# Patient Record
Sex: Female | Born: 1940
Health system: Southern US, Community
[De-identification: ages and names within clinical notes are randomized; demographics above are authoritative.]

## PROBLEM LIST (undated history)

## (undated) DIAGNOSIS — I10 Essential (primary) hypertension: Secondary | ICD-10-CM

## (undated) DIAGNOSIS — E7800 Pure hypercholesterolemia, unspecified: Secondary | ICD-10-CM

## (undated) DIAGNOSIS — K449 Diaphragmatic hernia without obstruction or gangrene: Secondary | ICD-10-CM

## (undated) DIAGNOSIS — E039 Hypothyroidism, unspecified: Secondary | ICD-10-CM

## (undated) DIAGNOSIS — C439 Malignant melanoma of skin, unspecified: Secondary | ICD-10-CM

## (undated) HISTORY — PX: TUBAL LIGATION: SHX77

## (undated) HISTORY — PX: TONSILLECTOMY AND ADENOIDECTOMY: SUR1326

---

## 2013-08-27 ENCOUNTER — Encounter: Payer: Self-pay | Admitting: Emergency Medicine

## 2013-08-27 ENCOUNTER — Emergency Department (INDEPENDENT_AMBULATORY_CARE_PROVIDER_SITE_OTHER)
Admission: EM | Admit: 2013-08-27 | Discharge: 2013-08-27 | Disposition: A | Discharge status: Home / Self Care | Payer: Medicare Other | Source: Home / Self Care | Attending: Emergency Medicine | Admitting: Emergency Medicine

## 2013-08-27 DIAGNOSIS — L237 Allergic contact dermatitis due to plants, except food: Secondary | ICD-10-CM

## 2013-08-27 DIAGNOSIS — L255 Unspecified contact dermatitis due to plants, except food: Secondary | ICD-10-CM

## 2013-08-27 DIAGNOSIS — C439 Malignant melanoma of skin, unspecified: Secondary | ICD-10-CM | POA: Insufficient documentation

## 2013-08-27 DIAGNOSIS — E039 Hypothyroidism, unspecified: Secondary | ICD-10-CM | POA: Insufficient documentation

## 2013-08-27 DIAGNOSIS — E7800 Pure hypercholesterolemia, unspecified: Secondary | ICD-10-CM | POA: Insufficient documentation

## 2013-08-27 DIAGNOSIS — K449 Diaphragmatic hernia without obstruction or gangrene: Secondary | ICD-10-CM | POA: Insufficient documentation

## 2013-08-27 DIAGNOSIS — I10 Essential (primary) hypertension: Secondary | ICD-10-CM | POA: Insufficient documentation

## 2013-08-27 HISTORY — DX: Hypothyroidism, unspecified: E03.9

## 2013-08-27 HISTORY — DX: Essential (primary) hypertension: I10

## 2013-08-27 HISTORY — DX: Pure hypercholesterolemia, unspecified: E78.00

## 2013-08-27 HISTORY — DX: Malignant melanoma of skin, unspecified: C43.9

## 2013-08-27 HISTORY — DX: Diaphragmatic hernia without obstruction or gangrene: K44.9

## 2013-08-27 MED ORDER — PREDNISONE (PAK) 10 MG PO TABS: ORAL_TABLET | Freq: Every day | ORAL | Status: DC

## 2013-08-27 MED ORDER — METHYLPREDNISOLONE ACETATE 80 MG/ML IJ SUSP
80.0000 mg | Freq: Once | INTRAMUSCULAR | Status: AC
  Administered 2013-08-27: 80 mg via INTRAMUSCULAR

## 2013-08-27 NOTE — ED Provider Notes (Signed)
CSN: [id number]     Arrival date & time 08/27/13  0914 History   First MD Initiated Contact with Patient 08/27/13 [phone]     Chief Complaint  Patient presents with  . Rash   (Consider location/radiation/quality/duration/timing/severity/associated sxs/prior Treatment) Patient is a 73 y.o. female presenting with rash.  Rash This patient complains of a RASH  Location: whole body  Onset: 4-5 days ago, after working out in the yard in seeing poison ivy   Course: Worsening Self-treated with: Over-the-counter cortisone and calamine           Improvement with treatment: Not helping much  History Itching: yes  Tenderness: no  New medications/antibiotics: no  Pet exposure: no  Recent travel or tropical exposure: no  New soaps, shampoos, detergent, clothing: no  Tick/insect exposure: no   Red Flags Feeling ill: no  Fever: no  Facial/tongue swelling/difficulty breathing:  no  Diabetic or immunocompromised: no      Past Medical History  Diagnosis Date  . Hypertension   . Hypercholesteremia   . Melanoma     removed  . Hypothyroid   . Hiatal hernia    Past Surgical History  Procedure Laterality Date  . Tonsillectomy and adenoidectomy    . Tubal ligation     Family History  Problem Relation Age of Onset  . Cancer Mother     Breast  . Heart attack Mother   . Cancer Sister     breast   History  Substance Use Topics  . Smoking status: Never Smoker   . Smokeless tobacco: Never Used  . Alcohol Use: No   OB History   Grav Para Term Preterm Abortions TAB SAB Ect Mult Living                 Review of Systems  Skin: Positive for rash.  All other systems reviewed and are negative.   Allergies  Review of patient's allergies indicates no known allergies.  Home Medications   Prior to Admission medications   Medication Sig Start Date End Date Taking? Authorizing Provider  aspirin 81 MG tablet Take 81 mg by mouth daily.   Yes Historical Provider, MD  levothyroxine  (SYNTHROID, LEVOTHROID) 25 MCG tablet Take 25 mcg by mouth daily before breakfast.   Yes Historical Provider, MD  olmesartan-hydrochlorothiazide (BENICAR HCT) 40-12.5 MG per tablet Take 1 tablet by mouth daily.   Yes Historical Provider, MD  omeprazole (PRILOSEC) 40 MG capsule Take 40 mg by mouth daily.   Yes Historical Provider, MD  rosuvastatin (CRESTOR) 10 MG tablet Take 10 mg by mouth daily.   Yes Historical Provider, MD  Vitamin D, Cholecalciferol, 1000 UNITS TABS Take 5,000 Int'l Units/day by mouth.   Yes Historical Provider, MD  predniSONE (STERAPRED UNI-PAK) 10 MG tablet Take by mouth daily. 6 day pack, use as directed 08/27/13   Janeann Forehand, MD   BP 125/84  Pulse 108  Temp(Src) 98.1 F (36.7 C) (Oral)  Ht 5\' 6"  (1.676 m)  Wt 194 lb (87.998 kg)  BMI 31.33 kg/m2  SpO2 96% Physical Exam  Nursing note and vitals reviewed. Constitutional: She is oriented to person, place, and time. She appears well-developed and well-nourished.  HENT:  Head: Normocephalic and atraumatic.  Eyes: No scleral icterus.  Neck: Neck supple.  Cardiovascular: Regular rhythm and normal heart sounds.   Pulmonary/Chest: Effort normal and breath sounds normal. No respiratory distress.  Neurological: She is alert and oriented to person, place, and time.  Skin: Skin  is warm and dry.  Scattered linear excoriations throughout entire body, worse on arms and torso.  No signs of active bacterial superinfection.  (There is also a raised circular lesion on her right leg that is inflamed but not infected the patient states she is followed by dermatologist for and has an upcoming appointment.)  Psychiatric: She has a normal mood and affect. Her speech is normal.    ED Course  Procedures (including critical care time) Labs Review Labs Reviewed - No data to display  Imaging Review No results found.   MDM   1. Poison ivy    Patient with poison ivy dermatitis.  A shot of Depo-Medrol was given in clinic in a  prescription for prednisone and was given outpatient.  Cool compresses topical cortisone and calamine lotion.  Avoid scratching.  Follow up if not improving.  Differential diagnosis of linear excoriations also include scabies if not improving.  Janeann Forehand, MD 08/27/13 1340

## 2013-08-27 NOTE — ED Notes (Signed)
Jennifer Ray complains of rash on her right arm and left side of face for 5 days. She reports the rash is very itchy. Denies fever, chills or sweats.

## 2015-03-09 ENCOUNTER — Ambulatory Visit (INDEPENDENT_AMBULATORY_CARE_PROVIDER_SITE_OTHER): Payer: Medicare Other | Admitting: Family Medicine

## 2015-03-09 ENCOUNTER — Emergency Department (INDEPENDENT_AMBULATORY_CARE_PROVIDER_SITE_OTHER): Payer: Medicare Other

## 2015-03-09 ENCOUNTER — Encounter: Payer: Self-pay | Admitting: *Deleted

## 2015-03-09 ENCOUNTER — Emergency Department (INDEPENDENT_AMBULATORY_CARE_PROVIDER_SITE_OTHER)
Admission: EM | Admit: 2015-03-09 | Discharge: 2015-03-09 | Disposition: A | Discharge status: Home / Self Care | Payer: Medicare Other | Source: Home / Self Care | Attending: Family Medicine | Admitting: Family Medicine

## 2015-03-09 DIAGNOSIS — M222X1 Patellofemoral disorders, right knee: Secondary | ICD-10-CM | POA: Diagnosis not present

## 2015-03-09 DIAGNOSIS — M179 Osteoarthritis of knee, unspecified: Secondary | ICD-10-CM

## 2015-03-09 DIAGNOSIS — M25561 Pain in right knee: Secondary | ICD-10-CM | POA: Insufficient documentation

## 2015-03-09 DIAGNOSIS — M1711 Unilateral primary osteoarthritis, right knee: Secondary | ICD-10-CM | POA: Diagnosis not present

## 2015-03-09 NOTE — Patient Instructions (Signed)
Thank you for coming in today. Call or go to the ER if you develop a large red swollen joint with extreme pain or oozing puss.  Return as needed for pain.   Arthritis Arthritis is a term that is commonly used to refer to joint pain or joint disease. There are more than 100 types of arthritis. CAUSES The most common cause of this condition is wear and tear of a joint. Other causes include:  Gout.  Inflammation of a joint.  An infection of a joint.  Sprains and other injuries near the joint.  A drug reaction or allergic reaction. In some cases, the cause may not be known. SYMPTOMS The main symptom of this condition is pain in the joint with movement. Other symptoms include:  Redness, swelling, or stiffness at a joint.  Warmth coming from the joint.  Fever.  Overall feeling of illness. DIAGNOSIS This condition may be diagnosed with a physical exam and tests, including:  Blood tests.  Urine tests.  Imaging tests, such as MRI, X-rays, or a CT scan. Sometimes, fluid is removed from a joint for testing. TREATMENT Treatment for this condition may involve:  Treatment of the cause, if it is known.  Rest.  Raising (elevating) the joint.  Applying cold or hot packs to the joint.  Medicines to improve symptoms and reduce inflammation.  Injections of a steroid such as cortisone into the joint to help reduce pain and inflammation. Depending on the cause of your arthritis, you may need to make lifestyle changes to reduce stress on your joint. These changes may include exercising more and losing weight. HOME CARE INSTRUCTIONS Medicines  Take over-the-counter and prescription medicines only as told by your health care provider.  Do not take aspirin to relieve pain if gout is suspected. Activities  Rest your joint if told by your health care provider. Rest is important when your disease is active and your joint feels painful, swollen, or stiff.  Avoid activities that make  the pain worse. It is important to balance activity with rest.  Exercise your joint regularly with range-of-motion exercises as told by your health care provider. Try doing low-impact exercise, such as:  Swimming.  Water aerobics.  Biking.  Walking. Joint Care  If your joint is swollen, keep it elevated if told by your health care provider.  If your joint feels stiff in the morning, try taking a warm shower.  If directed, apply heat to the joint. If you have diabetes, do not apply heat without permission from your health care provider.  Put a towel between the joint and the hot pack or heating pad.  Leave the heat on the area for 20-30 minutes.  If directed, apply ice to the joint:  Put ice in a plastic bag.  Place a towel between your skin and the bag.  Leave the ice on for 20 minutes, 2-3 times per day.  Keep all follow-up visits as told by your health care provider. This is important. SEEK MEDICAL CARE IF:  The pain gets worse.  You have a fever. SEEK IMMEDIATE MEDICAL CARE IF:  You develop severe joint pain, swelling, or redness.  Many joints become painful and swollen.  You develop severe back pain.  You develop severe weakness in your leg.  You cannot control your bladder or bowels.   This information is not intended to replace advice given to you by your health care provider. Make sure you discuss any questions you have with your health care  provider.   Document Released: 04/24/2004 Document Revised: 12/06/2014 Document Reviewed: 06/12/2014 Elsevier Interactive Patient Education [employer].

## 2015-03-09 NOTE — Progress Notes (Signed)
Subjective:    I'm seeing this patient as a consultation for:  Dr Assunta Found  CC: Right knee pain  HPI: Right knee pain. Patient has ongoing right knee pain for months to years. In June in [name] she had a steroid injection to the right knee that helped a lot. She's been living with her daughter for a few months now and has been climbing stairs more than usual. She notes anterior knee pain for the last 2 weeks it's been very bothersome. She's tried ice, heat, Tylenol, and rest which have not helped much. She denies any radiating pain weakness or numbness fevers or chills. She is interested in a steroid injection today if possible.  Past medical history, Surgical history, Family history not pertinant except as noted below, Social history, Allergies, and medications have been entered into the medical record, reviewed, and no changes needed.   Review of Systems: No headache, visual changes, nausea, vomiting, diarrhea, constipation, dizziness, abdominal pain, skin rash, fevers, chills, night sweats, weight loss, swollen lymph nodes, body aches, joint swelling, muscle aches, chest pain, shortness of breath, mood changes, visual or auditory hallucinations.   Objective:   BP 120/72 mmHg  Pulse 91  Resp 16  Wt 182 lb (82.555 kg)  SpO2 97% General: Well Developed, well nourished, and in no acute distress.  Neuro/Psych: Alert and oriented x3, extra-ocular muscles intact, able to move all 4 extremities, sensation grossly intact. Skin: Warm and dry, no rashes noted.  Respiratory: Not using accessory muscles, speaking in full sentences, trachea midline.  Cardiovascular: Pulses palpable, no extremity edema. Abdomen: Does not appear distended. MSK: Right knee is significant for moderate effusion. Range of motion is normal with 2+ retropatellar crepitations. Nontender. Stable ligaments exam with negative anterior drawer valgus and varus stress. Negative McMurray's testing. Pulses capillary refill and  sensation intact distally.  Procedure: Real-time Ultrasound Guided Injection of right knee  Device: GE Logiq E  Images permanently stored and available for review in the ultrasound unit. Verbal informed consent obtained. Discussed risks and benefits of procedure. Warned about infection bleeding damage to structures skin hypopigmentation and fat atrophy among others. Patient expresses understanding and agreement Time-out conducted.  Noted no overlying erythema, induration, or other signs of local infection.  Skin prepped in a sterile fashion.  Local anesthesia: Topical Ethyl chloride.  With sterile technique and under real time ultrasound guidance: 80 mg of Depo-Medrol and 4 mL of Marcaine injected easily.  Completed without difficulty  Pain immediately resolved suggesting accurate placement of the medication.  Advised to call if fevers/chills, erythema, induration, drainage, or persistent bleeding.  Images permanently stored and available for review in the ultrasound unit.  Impression: Technically successful ultrasound guided injection.    No results found for this or any previous visit (from the past 24 hour(s)). Dg Knee 2 Views Right  03/09/2015  CLINICAL DATA:  74 year old female with history of degenerative joint disease complaining of increasing right-sided knee pain for the past 2 weeks. EXAM: RIGHT KNEE - 1-2 VIEW COMPARISON:  No priors. FINDINGS: Two views of the right knee demonstrate joint space narrowing, subchondral sclerosis, subchondral cyst formation and osteophyte formation, compatible with osteoarthritis, most severe in the medial and patellofemoral compartments. No acute displaced fracture or dislocation. IMPRESSION: 1. No acute abnormality of the right knee. 2. Tricompartmental osteoarthritis, most severe in the medial and patellofemoral compartments, as above. Electronically Signed   By: Vinnie Langton M.D.   On: 03/09/2015 10:29    Impression and  Recommendations:  This case required medical decision making of moderate complexity.

## 2015-03-09 NOTE — Assessment & Plan Note (Signed)
Due to DJD and patellofemoral chondromalacia. Treat with injection as above. Recommend compressive knee garment. Return as needed.

## 2015-03-09 NOTE — ED Provider Notes (Signed)
CSN: [id number]     Arrival date & time 03/09/15  0908 History   First MD Initiated Contact with Patient 03/09/15 [phone]     Chief Complaint  Patient presents with  . Knee Pain      HPI Comments: Patient lives in [place], [place], visiting her daughter locally for the holidays.  She plans to go back home next month. She has a history of DJD of her right knee, with increased anterior pain for about two weeks.  She states that she has been walking more, and has pain climbing stairs.  No recent injury.  She states that her orthopedist injected her right knee with a corticosteroid about four months ago, providing increased pain relief.  She states that a knee brace has been somewhat helpful, but Tylenol does not provide adequate relief.  She is reluctant to take NSAID's because of her hypertension.  She has difficulty sleeping because of her knee pain.  Patient is a 74 y.o. female presenting with knee pain. The history is provided by the patient.  Knee Pain Location:  Knee Time since incident:  2 weeks Injury: no   Knee location:  R knee Pain details:    Quality:  Aching   Radiates to:  Does not radiate   Severity:  Moderate   Onset quality:  Gradual   Duration:  2 weeks   Timing:  Constant   Progression:  Worsening Chronicity:  Chronic Prior injury to area:  No Relieved by:  Nothing Worsened by:  Bearing weight, activity and extension Ineffective treatments:  Acetaminophen Associated symptoms: stiffness   Associated symptoms: no decreased ROM, no fever, no muscle weakness, no numbness, no swelling and no tingling     Past Medical History  Diagnosis Date  . Hypertension   . Hypercholesteremia   . Melanoma ([place])     removed  . Hypothyroid   . Hiatal hernia    Past Surgical History  Procedure Laterality Date  . Tonsillectomy and adenoidectomy    . Tubal ligation     Family History  Problem Relation Age of Onset  . Cancer Mother     Breast  . Heart attack Mother   . Cancer Sister      breast   Social History  Substance Use Topics  . Smoking status: Never Smoker   . Smokeless tobacco: Never Used  . Alcohol Use: No   OB History    No data available     Review of Systems  Constitutional: Negative for fever.  Musculoskeletal: Positive for stiffness.  All other systems reviewed and are negative.   Allergies  Review of patient's allergies indicates no known allergies.  Home Medications   Prior to Admission medications   Medication Sig Start Date End Date Taking? Authorizing Provider  levothyroxine (SYNTHROID, LEVOTHROID) 25 MCG tablet Take 25 mcg by mouth daily before breakfast.   Yes Historical Provider, MD  losartan-hydrochlorothiazide (HYZAAR) 100-12.5 MG tablet Take 1 tablet by mouth daily.   Yes Historical Provider, MD  omeprazole (PRILOSEC) 40 MG capsule Take 40 mg by mouth daily.   Yes Historical Provider, MD  rosuvastatin (CRESTOR) 10 MG tablet Take 10 mg by mouth daily.   Yes Historical Provider, MD  aspirin 81 MG tablet Take 81 mg by mouth daily.    Historical Provider, MD  Vitamin D, Cholecalciferol, 1000 UNITS TABS Take 5,000 Int'l Units/day by mouth.    Historical Provider, MD   Meds Ordered and Administered this Visit  Medications - No  data to display  BP 120/72 mmHg  Pulse 91  Resp 16  Wt 182 lb (82.555 kg)  SpO2 97% No data found.   Physical Exam  Constitutional: She is oriented to person, place, and time. She appears well-developed and well-nourished. No distress.  HENT:  Head: Normocephalic.  Eyes: Pupils are equal, round, and reactive to light.  Cardiovascular: Normal heart sounds.   Pulmonary/Chest: Breath sounds normal.  Musculoskeletal:       Right knee: She exhibits bony tenderness. She exhibits normal range of motion, no swelling, no effusion, no ecchymosis, no deformity, no erythema and normal patellar mobility. Tenderness found. Medial joint line and lateral joint line tenderness noted. No patellar tendon tenderness  noted.       Legs: Right knee:  No effusion, erythema, or warmth.  Knee stable, negative drawer test.  McMurray test difficult to perform (causes pain).  There is tenderness to palpation over medial and lateral joint lines.  There is tenderness to palpation at medial edge of patella.  Pressure applied to the patella during resisted extension causes pain.  Neurological: She is alert and oriented to person, place, and time.  Skin: Skin is warm and dry.  Nursing note and vitals reviewed.   ED Course  Procedures  None   Imaging Review No results found.     MDM   1. Osteoarthritis of right knee, unspecified osteoarthritis type   2. Right patellofemoral syndrome    Will arrange consultation with Dr. Lynne Leader to consider corticosteroid injection and follow-up.    Kandra Nicolas, MD 03/15/15 [phone]

## 2015-03-09 NOTE — ED Notes (Signed)
Pt c/o right knee pain x 2 weeks. Used cold and heat application and elevation. Tylenol without relief. H/o arthritis in right knee.

## 2015-03-15 NOTE — Discharge Instructions (Signed)

## 2015-04-17 DIAGNOSIS — L570 Actinic keratosis: Secondary | ICD-10-CM | POA: Diagnosis not present

## 2015-04-17 DIAGNOSIS — Z85828 Personal history of other malignant neoplasm of skin: Secondary | ICD-10-CM | POA: Diagnosis not present

## 2015-04-17 DIAGNOSIS — Z08 Encounter for follow-up examination after completed treatment for malignant neoplasm: Secondary | ICD-10-CM | POA: Diagnosis not present

## 2015-04-17 DIAGNOSIS — L820 Inflamed seborrheic keratosis: Secondary | ICD-10-CM | POA: Diagnosis not present

## 2015-07-05 DIAGNOSIS — L821 Other seborrheic keratosis: Secondary | ICD-10-CM | POA: Diagnosis not present

## 2015-07-05 DIAGNOSIS — D485 Neoplasm of uncertain behavior of skin: Secondary | ICD-10-CM | POA: Diagnosis not present

## 2015-07-17 DIAGNOSIS — G4730 Sleep apnea, unspecified: Secondary | ICD-10-CM | POA: Diagnosis not present

## 2015-07-17 DIAGNOSIS — Z0189 Encounter for other specified special examinations: Secondary | ICD-10-CM | POA: Diagnosis not present

## 2015-07-17 DIAGNOSIS — E782 Mixed hyperlipidemia: Secondary | ICD-10-CM | POA: Diagnosis not present

## 2015-07-17 DIAGNOSIS — E039 Hypothyroidism, unspecified: Secondary | ICD-10-CM | POA: Diagnosis not present

## 2015-07-17 DIAGNOSIS — Z1231 Encounter for screening mammogram for malignant neoplasm of breast: Secondary | ICD-10-CM | POA: Diagnosis not present

## 2015-07-17 DIAGNOSIS — Z803 Family history of malignant neoplasm of breast: Secondary | ICD-10-CM | POA: Diagnosis not present

## 2015-07-17 DIAGNOSIS — I10 Essential (primary) hypertension: Secondary | ICD-10-CM | POA: Diagnosis not present

## 2015-10-09 DIAGNOSIS — Z08 Encounter for follow-up examination after completed treatment for malignant neoplasm: Secondary | ICD-10-CM | POA: Diagnosis not present

## 2015-10-09 DIAGNOSIS — Z85820 Personal history of malignant melanoma of skin: Secondary | ICD-10-CM | POA: Diagnosis not present

## 2015-12-04 DIAGNOSIS — Z23 Encounter for immunization: Secondary | ICD-10-CM | POA: Diagnosis not present

## 2016-01-30 DIAGNOSIS — K219 Gastro-esophageal reflux disease without esophagitis: Secondary | ICD-10-CM | POA: Diagnosis not present

## 2016-01-30 DIAGNOSIS — E039 Hypothyroidism, unspecified: Secondary | ICD-10-CM | POA: Diagnosis not present

## 2016-01-30 DIAGNOSIS — Z23 Encounter for immunization: Secondary | ICD-10-CM | POA: Diagnosis not present

## 2016-01-30 DIAGNOSIS — I10 Essential (primary) hypertension: Secondary | ICD-10-CM | POA: Diagnosis not present

## 2016-01-30 DIAGNOSIS — E782 Mixed hyperlipidemia: Secondary | ICD-10-CM | POA: Diagnosis not present

## 2016-03-12 DIAGNOSIS — Z79899 Other long term (current) drug therapy: Secondary | ICD-10-CM | POA: Diagnosis not present

## 2016-03-12 DIAGNOSIS — D649 Anemia, unspecified: Secondary | ICD-10-CM | POA: Diagnosis not present

## 2016-03-12 DIAGNOSIS — I10 Essential (primary) hypertension: Secondary | ICD-10-CM | POA: Diagnosis not present

## 2016-03-12 DIAGNOSIS — Z0189 Encounter for other specified special examinations: Secondary | ICD-10-CM | POA: Diagnosis not present

## 2016-03-12 DIAGNOSIS — E782 Mixed hyperlipidemia: Secondary | ICD-10-CM | POA: Diagnosis not present

## 2016-03-12 DIAGNOSIS — E538 Deficiency of other specified B group vitamins: Secondary | ICD-10-CM | POA: Diagnosis not present

## 2016-04-22 DIAGNOSIS — L820 Inflamed seborrheic keratosis: Secondary | ICD-10-CM | POA: Diagnosis not present

## 2016-04-22 DIAGNOSIS — Z85820 Personal history of malignant melanoma of skin: Secondary | ICD-10-CM | POA: Diagnosis not present

## 2016-04-22 DIAGNOSIS — Z08 Encounter for follow-up examination after completed treatment for malignant neoplasm: Secondary | ICD-10-CM | POA: Diagnosis not present

## 2016-04-22 DIAGNOSIS — L570 Actinic keratosis: Secondary | ICD-10-CM | POA: Diagnosis not present

## 2016-05-29 DIAGNOSIS — H02834 Dermatochalasis of left upper eyelid: Secondary | ICD-10-CM | POA: Diagnosis not present

## 2016-05-29 DIAGNOSIS — H527 Unspecified disorder of refraction: Secondary | ICD-10-CM | POA: Diagnosis not present

## 2016-05-29 DIAGNOSIS — H02831 Dermatochalasis of right upper eyelid: Secondary | ICD-10-CM | POA: Diagnosis not present

## 2016-05-29 DIAGNOSIS — H11001 Unspecified pterygium of right eye: Secondary | ICD-10-CM | POA: Diagnosis not present

## 2016-05-29 DIAGNOSIS — H25813 Combined forms of age-related cataract, bilateral: Secondary | ICD-10-CM | POA: Diagnosis not present

## 2016-07-01 DIAGNOSIS — I10 Essential (primary) hypertension: Secondary | ICD-10-CM | POA: Diagnosis not present

## 2016-07-01 DIAGNOSIS — H25813 Combined forms of age-related cataract, bilateral: Secondary | ICD-10-CM | POA: Diagnosis not present

## 2016-07-08 DIAGNOSIS — Z7982 Long term (current) use of aspirin: Secondary | ICD-10-CM | POA: Diagnosis not present

## 2016-07-08 DIAGNOSIS — Z79899 Other long term (current) drug therapy: Secondary | ICD-10-CM | POA: Diagnosis not present

## 2016-07-08 DIAGNOSIS — M1990 Unspecified osteoarthritis, unspecified site: Secondary | ICD-10-CM | POA: Diagnosis not present

## 2016-07-08 DIAGNOSIS — G4730 Sleep apnea, unspecified: Secondary | ICD-10-CM | POA: Diagnosis not present

## 2016-07-08 DIAGNOSIS — G4733 Obstructive sleep apnea (adult) (pediatric): Secondary | ICD-10-CM | POA: Diagnosis not present

## 2016-07-08 DIAGNOSIS — H25813 Combined forms of age-related cataract, bilateral: Secondary | ICD-10-CM | POA: Diagnosis not present

## 2016-07-08 DIAGNOSIS — E039 Hypothyroidism, unspecified: Secondary | ICD-10-CM | POA: Diagnosis not present

## 2016-07-08 DIAGNOSIS — K449 Diaphragmatic hernia without obstruction or gangrene: Secondary | ICD-10-CM | POA: Diagnosis not present

## 2016-07-08 DIAGNOSIS — H25811 Combined forms of age-related cataract, right eye: Secondary | ICD-10-CM | POA: Diagnosis not present

## 2016-07-08 DIAGNOSIS — K219 Gastro-esophageal reflux disease without esophagitis: Secondary | ICD-10-CM | POA: Diagnosis not present

## 2016-07-08 DIAGNOSIS — E785 Hyperlipidemia, unspecified: Secondary | ICD-10-CM | POA: Diagnosis not present

## 2016-07-08 DIAGNOSIS — I10 Essential (primary) hypertension: Secondary | ICD-10-CM | POA: Diagnosis not present

## 2016-07-08 DIAGNOSIS — Z85820 Personal history of malignant melanoma of skin: Secondary | ICD-10-CM | POA: Diagnosis not present

## 2016-07-09 DIAGNOSIS — H25812 Combined forms of age-related cataract, left eye: Secondary | ICD-10-CM | POA: Diagnosis not present

## 2016-07-09 DIAGNOSIS — Z961 Presence of intraocular lens: Secondary | ICD-10-CM | POA: Diagnosis not present

## 2016-07-09 DIAGNOSIS — H11001 Unspecified pterygium of right eye: Secondary | ICD-10-CM | POA: Diagnosis not present

## 2016-07-09 DIAGNOSIS — H02831 Dermatochalasis of right upper eyelid: Secondary | ICD-10-CM | POA: Diagnosis not present

## 2016-07-22 DIAGNOSIS — E039 Hypothyroidism, unspecified: Secondary | ICD-10-CM | POA: Diagnosis not present

## 2016-07-22 DIAGNOSIS — K449 Diaphragmatic hernia without obstruction or gangrene: Secondary | ICD-10-CM | POA: Diagnosis not present

## 2016-07-22 DIAGNOSIS — Z85820 Personal history of malignant melanoma of skin: Secondary | ICD-10-CM | POA: Diagnosis not present

## 2016-07-22 DIAGNOSIS — H25813 Combined forms of age-related cataract, bilateral: Secondary | ICD-10-CM | POA: Diagnosis not present

## 2016-07-22 DIAGNOSIS — G4733 Obstructive sleep apnea (adult) (pediatric): Secondary | ICD-10-CM | POA: Diagnosis not present

## 2016-07-22 DIAGNOSIS — M1990 Unspecified osteoarthritis, unspecified site: Secondary | ICD-10-CM | POA: Diagnosis not present

## 2016-07-22 DIAGNOSIS — I10 Essential (primary) hypertension: Secondary | ICD-10-CM | POA: Diagnosis not present

## 2016-07-22 DIAGNOSIS — H25812 Combined forms of age-related cataract, left eye: Secondary | ICD-10-CM | POA: Diagnosis not present

## 2016-07-22 DIAGNOSIS — K219 Gastro-esophageal reflux disease without esophagitis: Secondary | ICD-10-CM | POA: Diagnosis not present

## 2016-07-22 DIAGNOSIS — E785 Hyperlipidemia, unspecified: Secondary | ICD-10-CM | POA: Diagnosis not present

## 2016-07-23 DIAGNOSIS — H02831 Dermatochalasis of right upper eyelid: Secondary | ICD-10-CM | POA: Diagnosis not present

## 2016-07-23 DIAGNOSIS — H02834 Dermatochalasis of left upper eyelid: Secondary | ICD-10-CM | POA: Diagnosis not present

## 2016-07-23 DIAGNOSIS — Z961 Presence of intraocular lens: Secondary | ICD-10-CM | POA: Diagnosis not present

## 2016-07-23 DIAGNOSIS — H11001 Unspecified pterygium of right eye: Secondary | ICD-10-CM | POA: Diagnosis not present

## 2016-08-13 DIAGNOSIS — H04123 Dry eye syndrome of bilateral lacrimal glands: Secondary | ICD-10-CM | POA: Diagnosis not present

## 2016-08-13 DIAGNOSIS — Z961 Presence of intraocular lens: Secondary | ICD-10-CM | POA: Diagnosis not present

## 2016-08-26 DIAGNOSIS — D485 Neoplasm of uncertain behavior of skin: Secondary | ICD-10-CM | POA: Diagnosis not present

## 2016-08-26 DIAGNOSIS — L821 Other seborrheic keratosis: Secondary | ICD-10-CM | POA: Diagnosis not present

## 2016-08-26 DIAGNOSIS — L820 Inflamed seborrheic keratosis: Secondary | ICD-10-CM | POA: Diagnosis not present

## 2016-12-10 DIAGNOSIS — I10 Essential (primary) hypertension: Secondary | ICD-10-CM | POA: Diagnosis not present

## 2016-12-10 DIAGNOSIS — E039 Hypothyroidism, unspecified: Secondary | ICD-10-CM | POA: Diagnosis not present

## 2016-12-10 DIAGNOSIS — Z0189 Encounter for other specified special examinations: Secondary | ICD-10-CM | POA: Diagnosis not present

## 2016-12-10 DIAGNOSIS — E782 Mixed hyperlipidemia: Secondary | ICD-10-CM | POA: Diagnosis not present

## 2016-12-10 DIAGNOSIS — Z23 Encounter for immunization: Secondary | ICD-10-CM | POA: Diagnosis not present

## 2016-12-10 DIAGNOSIS — D649 Anemia, unspecified: Secondary | ICD-10-CM | POA: Diagnosis not present

## 2016-12-10 DIAGNOSIS — Z1231 Encounter for screening mammogram for malignant neoplasm of breast: Secondary | ICD-10-CM | POA: Diagnosis not present

## 2017-02-05 DIAGNOSIS — E782 Mixed hyperlipidemia: Secondary | ICD-10-CM | POA: Diagnosis not present

## 2017-02-05 DIAGNOSIS — I10 Essential (primary) hypertension: Secondary | ICD-10-CM | POA: Diagnosis not present

## 2017-02-05 DIAGNOSIS — E039 Hypothyroidism, unspecified: Secondary | ICD-10-CM | POA: Diagnosis not present

## 2017-04-28 DIAGNOSIS — Z08 Encounter for follow-up examination after completed treatment for malignant neoplasm: Secondary | ICD-10-CM | POA: Diagnosis not present

## 2017-04-28 DIAGNOSIS — Z85820 Personal history of malignant melanoma of skin: Secondary | ICD-10-CM | POA: Diagnosis not present

## 2017-04-28 DIAGNOSIS — L570 Actinic keratosis: Secondary | ICD-10-CM | POA: Diagnosis not present

## 2017-05-30 ENCOUNTER — Emergency Department (INDEPENDENT_AMBULATORY_CARE_PROVIDER_SITE_OTHER)
Admission: EM | Admit: 2017-05-30 | Discharge: 2017-05-30 | Disposition: A | Discharge status: Home / Self Care | Payer: Medicare Other | Source: Home / Self Care | Attending: Family Medicine | Admitting: Family Medicine

## 2017-05-30 ENCOUNTER — Encounter: Payer: Self-pay | Admitting: Emergency Medicine

## 2017-05-30 DIAGNOSIS — R69 Illness, unspecified: Secondary | ICD-10-CM

## 2017-05-30 DIAGNOSIS — J111 Influenza due to unidentified influenza virus with other respiratory manifestations: Secondary | ICD-10-CM

## 2017-05-30 MED ORDER — OSELTAMIVIR PHOSPHATE 75 MG PO CAPS: 75.0000 mg | ORAL_CAPSULE | Freq: Two times a day (BID) | ORAL | 0 refills | Status: DC

## 2017-05-30 NOTE — Discharge Instructions (Signed)
Take plain guaifenesin (1200mg  extended release tabs such as Mucinex) twice daily, with plenty of water, for cough and congestion. Get adequate rest.   Also recommend using saline nasal spray several times daily and saline nasal irrigation (AYR is a common brand).   Try warm salt water gargles for sore throat.  Stop all antihistamines for now, and other non-prescription cough/cold preparations. May take Tylenol for fever, headache, etc. May take Delsym Cough Suppressant at bedtime for nighttime cough.  If symptoms become significantly worse during the night or over the weekend, proceed to the local emergency room.

## 2017-05-30 NOTE — ED Provider Notes (Signed)
[facility]    CSN: [id number] Arrival date & time: 05/30/17  1312     History   Chief Complaint Chief Complaint  Patient presents with  . Cough    HPI Gravity is a 77 y.o. female.   Last night patient developed flu-like symptoms including myalgias, fever/chills, fatigue, and cough.  Also has mild nasal congestion, and today developed headache and wheezing.   Cough is non-productive and somewhat worse at night.  No pleuritic pain or shortness of breath.   The history is provided by the patient.    Past Medical History:  Diagnosis Date  . Hiatal hernia   . Hypercholesteremia   . Hypertension   . Hypothyroid   . Melanoma [facility])    removed    Patient Active Problem List   Diagnosis Date Noted  . Right knee pain 03/09/2015  . Hypertension   . Hypercholesteremia   . Melanoma ([place])   . Hypothyroid   . Hiatal hernia     Past Surgical History:  Procedure Laterality Date  . TONSILLECTOMY AND ADENOIDECTOMY    . TUBAL LIGATION      OB History    No data available       Home Medications    Prior to Admission medications   Medication Sig Start Date End Date Taking? Authorizing Provider  aspirin 81 MG tablet Take 81 mg by mouth daily.    [provider]  levothyroxine (SYNTHROID, LEVOTHROID) 25 MCG tablet Take 25 mcg by mouth daily before breakfast.    [provider]  losartan-hydrochlorothiazide (HYZAAR) 100-12.5 MG tablet Take 1 tablet by mouth daily.    [provider]  omeprazole (PRILOSEC) 40 MG capsule Take 40 mg by mouth daily.    [provider]  oseltamivir (TAMIFLU) 75 MG capsule Take 1 capsule (75 mg total) by mouth every 12 (twelve) hours. 05/30/17   Kandra Nicolas, MD  rosuvastatin (CRESTOR) 10 MG tablet Take 10 mg by mouth daily.    [provider]  Vitamin D, Cholecalciferol, 1000 UNITS TABS Take 5,000 Int'l Units/day by mouth.    [provider]    Family History Family  History  Problem Relation Age of Onset  . Cancer Mother        Breast  . Heart attack Mother   . Cancer Sister        breast    Social History Social History   Tobacco Use  . Smoking status: Never Smoker  . Smokeless tobacco: Never Used  Substance Use Topics  . Alcohol use: No  . Drug use: No     Allergies   Patient has no known allergies.   Review of Systems Review of Systems No sore throat + cough No pleuritic pain + wheezing + nasal congestion + post-nasal drainage No sinus pain/pressure No itchy/red eyes No earache No hemoptysis No SOB + fever, + chills No nausea No vomiting No abdominal pain No diarrhea No urinary symptoms No skin rash + fatigue + myalgias + headache Used OTC meds without relief   Physical Exam Triage Vital Signs ED Triage Vitals  Enc Vitals Group     BP 05/30/17 1346 133/86     Pulse Rate 05/30/17 1346 100     Resp --      Temp 05/30/17 1346 100.1 F (37.8 C)     Temp Source 05/30/17 1346 Oral     SpO2 05/30/17 1346 95 %     Weight 05/30/17  1347 176 lb (79.8 kg)     Height 05/30/17 1347 5\' 7"  (1.702 m)     Head Circumference --      Peak Flow --      Pain Score 05/30/17 1347 0     Pain Loc --      Pain Edu? --      Excl. in [place]? --    No data found.  Updated Vital Signs BP 133/86 (BP Location: Right Arm)   Pulse 100   Temp 100.1 F (37.8 C) (Oral)   Ht 5\' 7"  (1.702 m)   Wt 176 lb (79.8 kg)   SpO2 95%   BMI 27.57 kg/m   Visual Acuity Right Eye Distance:   Left Eye Distance:   Bilateral Distance:    Right Eye Near:   Left Eye Near:    Bilateral Near:     Physical Exam Nursing notes and Vital Signs reviewed. Appearance:  Patient appears stated age, and in no acute distress Eyes:  Pupils are equal, round, and reactive to light and accomodation.  Extraocular movement is intact.  Conjunctivae are not inflamed  Ears:  Canals normal.  Tympanic membranes normal.  Nose:  Mildly congested turbinates.  No sinus  tenderness.   Pharynx:  Normal Neck:  Supple.  Enlarged posterior/lateral nodes are palpated bilaterally, tender to palpation on the left.   Lungs:  Clear to auscultation.  Breath sounds are equal.  Moving air well. Heart:  Regular rate and rhythm without murmurs, rubs, or gallops.  Abdomen:  Nontender without masses or hepatosplenomegaly.  Bowel sounds are present.  No CVA or flank tenderness.  Extremities:  No edema.  Skin:  No rash present.    UC Treatments / Results  Labs (all labs ordered are listed, but only abnormal results are displayed) Labs Reviewed - No data to display  EKG  EKG Interpretation None       Radiology No results found.  Procedures Procedures (including critical care time)  Medications Ordered in UC Medications - No data to display   Initial Impression / Assessment and Plan / UC Course  I have reviewed the triage vital signs and the nursing notes.  Pertinent labs & imaging results that were available during my care of the patient were reviewed by me and considered in my medical decision making (see chart for details).    Begin Tamiflu. Take plain guaifenesin (1200mg  extended release tabs such as Mucinex) twice daily, with plenty of water, for cough and congestion. Get adequate rest.   Also recommend using saline nasal spray several times daily and saline nasal irrigation (AYR is a common brand).   Try warm salt water gargles for sore throat.  Stop all antihistamines for now, and other non-prescription cough/cold preparations. May take Tylenol for fever, headache, etc. May take Delsym Cough Suppressant at bedtime for nighttime cough.  If symptoms become significantly worse during the night or over the weekend, proceed to the local emergency room.  Followup with Family Doctor if not improved in about 5 or 6 days.  Final Clinical Impressions(s) / UC Diagnoses   Final diagnoses:  Influenza-like illness    ED Discharge Orders        Ordered     oseltamivir (TAMIFLU) 75 MG capsule  Every 12 hours     05/30/17 1408          Kandra Nicolas, MD 06/06/17 [phone]

## 2017-05-30 NOTE — ED Triage Notes (Signed)
Patient complaining of productive cough, wheezing, runny nose, clear mucous, fever.

## 2017-07-28 ENCOUNTER — Encounter: Payer: Self-pay | Admitting: Family Medicine

## 2017-07-28 ENCOUNTER — Telehealth: Payer: Self-pay | Admitting: Family Medicine

## 2017-07-28 ENCOUNTER — Ambulatory Visit (INDEPENDENT_AMBULATORY_CARE_PROVIDER_SITE_OTHER): Payer: Medicare Other | Admitting: Family Medicine

## 2017-07-28 VITALS — BP 118/82 | HR 92 | Wt 176.0 lb

## 2017-07-28 DIAGNOSIS — M25561 Pain in right knee: Secondary | ICD-10-CM | POA: Diagnosis not present

## 2017-07-28 DIAGNOSIS — M1711 Unilateral primary osteoarthritis, right knee: Secondary | ICD-10-CM | POA: Insufficient documentation

## 2017-07-28 DIAGNOSIS — G8929 Other chronic pain: Secondary | ICD-10-CM

## 2017-07-28 MED ORDER — DICLOFENAC SODIUM 1 % TD GEL: 4.0000 g | Freq: Four times a day (QID) | TRANSDERMAL | 11 refills | Status: DC

## 2017-07-28 NOTE — Patient Instructions (Signed)
Thank you for coming in today. Apply the diclofenac gel 4x daily for pain as needed.  You should hear about the Gel Shots.  I recommend exercise bike for leg strength.  I also recommend water aerobic for strength and flexibility.  If not doing well and we cannot get you better it is reasonable to consider knee replacement.    Dr Ninfa Linden at [place].  Or Dr Noemi Chapel at [facility] I recommend.    Sodium Hyaluronate intra-articular injection What is this medicine? SODIUM HYALURONATE (SOE dee um hye al yoor ON ate) is used to treat pain in the knee due to osteoarthritis. This medicine may be used for other purposes; ask your health care provider or pharmacist if you have questions. COMMON BRAND NAME(S): Amvisc, DUROLANE, Euflexxa, GELSYN-3, Hyalgan, Monovisc, Orthovisc, Supartz, Supartz FX What should I tell my health care provider before I take this medicine? They need to know if you have any of these conditions: -bleeding disorders -glaucoma -infection in the knee joint -skin conditions or sensitivity -skin infection -an unusual allergic reaction to sodium hyaluronate, other medicines, foods, dyes, or preservatives. Different brands of sodium hyaluronate contain different allergens. Some may contain egg. Talk to your doctor about your allergies to make sure that you get the right product. -pregnant or trying to get pregnant -breast-feeding How should I use this medicine? This medicine is for injection into the knee joint. It is given by a health care professional in a hospital or clinic setting. Talk to your pediatrician regarding the use of this medicine in children. Special care may be needed. Overdosage: If you think you have taken too much of this medicine contact a poison control center or emergency room at once. NOTE: This medicine is only for you. Do not share this medicine with others. What if I miss a dose? This does not apply. What may interact with this  medicine? Interactions are not expected. This list may not describe all possible interactions. Give your health care provider a list of all the medicines, herbs, non-prescription drugs, or dietary supplements you use. Also tell them if you smoke, drink alcohol, or use illegal drugs. Some items may interact with your medicine. What should I watch for while using this medicine? Tell your doctor or healthcare professional if your symptoms do not start to get better or if they get worse. If receiving this medicine for osteoarthritis, limit your activity after you receive your injection. Avoid physical activity for 48 hours following your injection to keep your knee from swelling. Do not stand on your feet for more than 1 hour at a time during the first 48 hours following your injection. Ask your doctor or healthcare professional about when you can begin major physical activity again. What side effects may I notice from receiving this medicine? Side effects that you should report to your doctor or health care professional as soon as possible: -allergic reactions like skin rash, itching or hives, swelling of the face, lips, or tongue -dizziness -facial flushing -pain, tingling, numbness in the hands or feet -vision changes if received this medicine during eye surgery Side effects that usually do not require medical attention (report to your doctor or health care professional if they continue or are bothersome): -back pain -bruising at site where injected -chills -diarrhea -fever -headache -joint pain -joint stiffness -joint swelling -muscle cramps -muscle pain -nausea, vomiting -pain, redness, or irritation at site where injected -weak or tired This list may not describe all possible side effects. Call  your doctor for medical advice about side effects. You may report side effects to FDA at 1-800-FDA-1088. Where should I keep my medicine? This drug is given in a hospital or clinic and will not  be stored at home. NOTE: This sheet is a summary. It may not cover all possible information. If you have questions about this medicine, talk to your doctor, pharmacist, or health care provider.  2018 Elsevier/Gold Standard (2015-04-19 08:34:51)

## 2017-07-28 NOTE — Progress Notes (Signed)
Jennifer Ray is a 77 y.o. female who presents to [facility] today for right knee pain.  Jennifer returns to clinic today for moderate right knee pain.  She was seen in 2016 for right knee where she was found to have degenerative changes on x-ray.  She received a steroid injection which only really lasted about a month.  She notes continued pain especially worse with activity and better with rest.  She is used topical Aspercreme which does help some.  Additionally she is tried topical diclofenac gel which helps as well.  She is tried Tylenol as well which helps some.  She said some benefit as well with compression sleeves which is now worn out.  She is interested in treatment options if possible.  The pain does limit her activity such as walking.   Past Medical History:  Diagnosis Date  . Hiatal hernia   . Hypercholesteremia   . Hypertension   . Hypothyroid   . Melanoma ([place])    removed   Past Surgical History:  Procedure Laterality Date  . TONSILLECTOMY AND ADENOIDECTOMY    . TUBAL LIGATION     Social History   Tobacco Use  . Smoking status: Never Smoker  . Smokeless tobacco: Never Used  Substance Use Topics  . Alcohol use: No     ROS:  As above   Medications: Current Outpatient Medications  Medication Sig Dispense Refill  . aspirin 81 MG tablet Take 81 mg by mouth daily.    Marland Kitchen levothyroxine (SYNTHROID, LEVOTHROID) 25 MCG tablet Take 25 mcg by mouth daily before breakfast.    . losartan-hydrochlorothiazide (HYZAAR) 100-12.5 MG tablet Take 1 tablet by mouth daily.    . rosuvastatin (CRESTOR) 10 MG tablet Take 10 mg by mouth daily.    . Vitamin D, Cholecalciferol, 1000 UNITS TABS Take 5,000 Int'l Units/day by mouth.    . diclofenac sodium (VOLTAREN) 1 % GEL Apply 4 g topically 4 (four) times daily. Knee DJD. 100 g 11   No current facility-administered medications for this visit.    No Known Allergies   Exam:  BP  118/82   Pulse 92   Wt 176 lb (79.8 kg)   BMI 27.57 kg/m  General: Well Developed, well nourished, and in no acute distress.  Neuro/Psych: Alert and oriented x3, extra-ocular muscles intact, able to move all 4 extremities, sensation grossly intact. Skin: Warm and dry, no rashes noted.  Respiratory: Not using accessory muscles, speaking in full sentences, trachea midline.  Cardiovascular: Pulses palpable, no extremity edema. Abdomen: Does not appear distended. MSK:  Right knee no effusion no erythema or induration or ecchymosis. Tender to palpation medial joint line. Range of motion 0-120 degrees with retropatellar crepitations. Stable ligamentous exam. Mildly positive McMurray's test. Intact flexion and extension strength. Antalgic gait present.    EXAM: RIGHT KNEE - 1-2 VIEW  COMPARISON:  No priors.  FINDINGS: Two views of the right knee demonstrate joint space narrowing, subchondral sclerosis, subchondral cyst formation and osteophyte formation, compatible with osteoarthritis, most severe in the medial and patellofemoral compartments. No acute displaced fracture or dislocation.  IMPRESSION: 1. No acute abnormality of the right knee. 2. Tricompartmental osteoarthritis, most severe in the medial and patellofemoral compartments, as above.   Electronically Signed   By: Vinnie Langton M.D.   On: 03/09/2015 10:29 I personally (independently) visualized and performed the interpretation of the images attached in this note.     Assessment and Plan: 77 y.o.  female with right knee pain due to DJD.  Discussed treatment options.  Hyaluronic acid injection is reasonable at this point. Will work on prior [employer] or equivalent. Additionally we discussed quad strengthening and weight loss.  Additionally recommend diclofenac gel and Tylenol.  Additionally we discussed if treatment failure total knee replacement. Return to clinic once we have a determination  regarding Orthovisc.    No orders of the defined types were placed in this encounter.  Meds ordered this encounter  Medications  . diclofenac sodium (VOLTAREN) 1 % GEL    Sig: Apply 4 g topically 4 (four) times daily. Knee DJD.    Dispense:  100 g    Refill:  11    Patient cannot take ibuprofen or aleve    Discussed warning signs or symptoms. Please see discharge instructions. Patient expresses understanding.  I spent 15 minutes with this patient, greater than 50% was face-to-face time counseling regarding ddx and treatment plan.

## 2017-07-28 NOTE — Telephone Encounter (Signed)
Information has been submitted to Orthovisc and awaiting determination.   

## 2017-07-30 ENCOUNTER — Encounter: Payer: Self-pay | Admitting: Osteopathic Medicine

## 2017-07-30 ENCOUNTER — Ambulatory Visit (INDEPENDENT_AMBULATORY_CARE_PROVIDER_SITE_OTHER): Payer: Medicare Other | Admitting: Osteopathic Medicine

## 2017-07-30 DIAGNOSIS — Z23 Encounter for immunization: Secondary | ICD-10-CM

## 2017-07-30 DIAGNOSIS — M1711 Unilateral primary osteoarthritis, right knee: Secondary | ICD-10-CM | POA: Diagnosis not present

## 2017-07-30 DIAGNOSIS — I10 Essential (primary) hypertension: Secondary | ICD-10-CM | POA: Diagnosis not present

## 2017-07-30 DIAGNOSIS — Z13820 Encounter for screening for osteoporosis: Secondary | ICD-10-CM | POA: Diagnosis not present

## 2017-07-30 DIAGNOSIS — E785 Hyperlipidemia, unspecified: Secondary | ICD-10-CM | POA: Diagnosis not present

## 2017-07-30 DIAGNOSIS — E039 Hypothyroidism, unspecified: Secondary | ICD-10-CM | POA: Diagnosis not present

## 2017-07-30 DIAGNOSIS — Z1231 Encounter for screening mammogram for malignant neoplasm of breast: Secondary | ICD-10-CM

## 2017-07-30 DIAGNOSIS — Z1239 Encounter for other screening for malignant neoplasm of breast: Secondary | ICD-10-CM

## 2017-07-30 MED ORDER — LEVOTHYROXINE SODIUM 25 MCG PO TABS: 25.0000 ug | ORAL_TABLET | Freq: Every day | ORAL | 3 refills | Status: DC

## 2017-07-30 MED ORDER — ASPIRIN 81 MG PO TABS: 81.0000 mg | ORAL_TABLET | Freq: Every day | ORAL | 3 refills | Status: DC

## 2017-07-30 MED ORDER — LOSARTAN POTASSIUM-HCTZ 100-12.5 MG PO TABS: 1.0000 | ORAL_TABLET | Freq: Every day | ORAL | 3 refills | Status: DC

## 2017-07-30 MED ORDER — ROSUVASTATIN CALCIUM 10 MG PO TABS: 10.0000 mg | ORAL_TABLET | Freq: Every day | ORAL | 3 refills | Status: DC

## 2017-07-30 MED ORDER — ASPIRIN 81 MG PO TABS: 81.0000 mg | ORAL_TABLET | Freq: Every day | ORAL | 3 refills | Status: AC

## 2017-07-30 NOTE — Telephone Encounter (Signed)
Orthovisc is covered.  There is no copay because the out of pocket has been met.   The deductible has been met and the patient's responsibility is 20% of the allowable amount for the drug and administration. Once the out of pocket is met, the patient will have no financial responsibility.   Patient was in the office and she is agreeable to the cost. Patient has appointments scheduled.

## 2017-07-30 NOTE — Progress Notes (Signed)
HPI: Jennifer Ray is a 77 y.o. female who  has a past medical history of Hiatal hernia, Hypercholesteremia, Hypertension, Hypothyroid, and Melanoma ([place]).  she presents to [facility] today, 07/30/17,  for chief complaint of: [name] new patient, retired, used to work as Production assistant, radio. She needs refills today for chornic medications, hasn't been out of anything.   Hypothyroid  - stable on current meds, no palpitations, fatigue, hair/skin changes.   HTN - stable on current meds   HLD - stable on current meds  GERD w/ history hiatal hernia - no antacids needed at this time  Cataracts bilateral - no other vision problems   Arthritis - hx DJD, R knee pain for which she has seen sports med on occasion.   Preventive: Last Mammo 2018, last colonoscopy 2012. Gets annual flu shots. Has had shingles shot. PNA vax q3 years per patient (?) she states these were done at [facility] last done 6 mos ago.        Past medical, surgical, social and family history reviewed:  Patient Active Problem List   Diagnosis Date Noted  . Right knee DJD 07/28/2017  . Right knee pain 03/09/2015  . Hypertension   . Hypercholesteremia   . Melanoma ([place])   . Hypothyroid   . Hiatal hernia     Past Surgical History:  Procedure Laterality Date  . TONSILLECTOMY AND ADENOIDECTOMY    . TUBAL LIGATION      Social History   Tobacco Use  . Smoking status: Never Smoker  . Smokeless tobacco: Never Used  Substance Use Topics  . Alcohol use: No    Family History  Problem Relation Age of Onset  . Cancer Mother        Breast  . Heart attack Mother   . Cancer Sister        breast     Current medication list and allergy/intolerance information reviewed:    Current Outpatient Medications  Medication Sig Dispense Refill  . aspirin 81 MG tablet Take 1 tablet (81 mg total) by mouth daily. 90 tablet 3  . diclofenac sodium  (VOLTAREN) 1 % GEL Apply 4 g topically 4 (four) times daily. Knee DJD. 100 g 11  . levothyroxine (SYNTHROID, LEVOTHROID) 25 MCG tablet Take 1 tablet (25 mcg total) by mouth daily before breakfast. 90 tablet 3  . losartan-hydrochlorothiazide (HYZAAR) 100-12.5 MG tablet Take 1 tablet by mouth daily. 90 tablet 3  . rosuvastatin (CRESTOR) 10 MG tablet Take 1 tablet (10 mg total) by mouth daily. 90 tablet 3  . Vitamin D, Cholecalciferol, 1000 UNITS TABS Take 5,000 Int'l Units/day by mouth.     No current facility-administered medications for this visit.     No Known Allergies    Review of Systems:  Constitutional:  No  fever, no chills, No recent illness, No unintentional weight changes. No significant fatigue.   HEENT: No  headache, no vision change, no hearing change, No sore throat, No  sinus pressure  Cardiac: No  chest pain, No  pressure, No palpitations, No  Orthopnea  Respiratory:  No  shortness of breath. No  Cough  Gastrointestinal: No  abdominal pain, No  nausea, No  vomiting,  No  blood in stool, No  diarrhea, No  constipation   Musculoskeletal: No new myalgia/arthralgia  Skin: No  Rash, No other wounds/concerning lesions  Genitourinary: No  incontinence, No  abnormal genital bleeding, No abnormal genital  discharge  Hem/Onc: No  easy bruising/bleeding, No  abnormal lymph node  Endocrine: No cold intolerance,  No heat intolerance. No polyuria/polydipsia/polyphagia   Neurologic: No  weakness, No  dizziness, No  slurred speech/focal weakness/facial droop  Psychiatric: No  concerns with depression, No  concerns with anxiety  Exam:  BP (!) 142/77 (BP Location: Left Arm, Patient Position: Sitting, Cuff Size: Normal)   Pulse 87   Temp 98.1 F (36.7 C) (Oral)   Wt 175 lb 4.8 oz (79.5 kg)   BMI 27.46 kg/m   Constitutional: VS see above. General Appearance: alert, well-developed, well-nourished, NAD  Eyes: Normal lids and conjunctive, non-icteric sclera  Ears, Nose,  Mouth, Throat: MMM, Normal external inspection ears/nares/mouth/lips/gums. TM normal bilaterally. Pharynx/tonsils no erythema, no exudate. Nasal mucosa normal.   Neck: No masses, trachea midline. No thyroid enlargement. No tenderness/mass appreciated. No lymphadenopathy  Respiratory: Normal respiratory effort. no wheeze, no rhonchi, no rales  Cardiovascular: S1/S2 normal, no murmur, no rub/gallop auscultated. RRR. No lower extremity edema.   Gastrointestinal: Nontender, no masses. No hepatomegaly, no splenomegaly. No hernia appreciated. Bowel sounds normal. Rectal exam deferred.   Musculoskeletal: Gait normal. No clubbing/cyanosis of digits.   Neurological: Normal balance/coordination. No tremor.   Skin: warm, dry, intact. No rash/ulcer. No concerning nevi or subq nodules on limited exam.   Psychiatric: Normal judgment/insight. Normal mood and affect. Oriented x3.      ASSESSMENT/PLAN:   Hypothyroidism, unspecified type - Plan: TSH  Essential hypertension - Plan: CBC, COMPLETE METABOLIC PANEL WITH GFR, Lipid panel  Breast cancer screening - Plan: MM 3D SCREEN BREAST BILATERAL  Osteoporosis screening - Plan: DG Bone Density  Primary osteoarthritis of right knee  Hyperlipidemia, unspecified hyperlipidemia type  Need for Tdap vaccination - Plan: Tdap vaccine greater than or equal to 7yo IM     Visit summary with medication list and pertinent instructions was printed for patient to review. All questions at time of visit were answered - patient instructed to contact office with any additional concerns. ER/RTC precautions were reviewed with the patient.   Follow-up plan: Return in about 6 months (around 01/30/2018) for follow-up HTN, hyperlipidemia, thyroid - ANNUAL CHECK-UP .  Note: Total time spent 30 minutes, greater than 50% of the visit was spent face-to-face counseling and coordinating care for the following: Diagnoses of Hypothyroidism, unspecified type, Essential  hypertension, Breast cancer screening, Osteoporosis screening, Primary osteoarthritis of right knee, Hyperlipidemia, unspecified hyperlipidemia type, and Need for Tdap vaccination were pertinent to this visit.Marland Kitchen  Please note: voice recognition software was used to produce this document, and typos may escape review. Please contact Dr. Sheppard Coil for any needed clarifications.

## 2017-07-31 DIAGNOSIS — I10 Essential (primary) hypertension: Secondary | ICD-10-CM | POA: Diagnosis not present

## 2017-07-31 DIAGNOSIS — E039 Hypothyroidism, unspecified: Secondary | ICD-10-CM | POA: Diagnosis not present

## 2017-07-31 LAB — COMPLETE METABOLIC PANEL WITH GFR
AG Ratio: 1.7 (calc) (ref 1.0–2.5)
ALT: 13 U/L (ref 6–29)
AST: 21 U/L (ref 10–35)
Albumin: 4.5 g/dL (ref 3.6–5.1)
Alkaline phosphatase (APISO): 65 U/L (ref 33–130)
BILIRUBIN TOTAL: 0.8 mg/dL (ref 0.2–1.2)
BUN/Creatinine Ratio: 19 (calc) (ref 6–22)
BUN: 20 mg/dL (ref 7–25)
CALCIUM: 10.5 mg/dL — AB (ref 8.6–10.4)
CHLORIDE: 106 mmol/L (ref 98–110)
CO2: 27 mmol/L (ref 20–32)
Creat: 1.06 mg/dL — ABNORMAL HIGH (ref 0.60–0.93)
GFR, EST NON AFRICAN AMERICAN: 51 mL/min/{1.73_m2} — AB (ref >=60)
GFR, Est African American: 59 mL/min/{1.73_m2} — ABNORMAL LOW (ref >=60)
Globulin: 2.7 g/dL (calc) (ref 1.9–3.7)
Glucose, Bld: 91 mg/dL (ref 65–99)
POTASSIUM: 4.3 mmol/L (ref 3.5–5.3)
Sodium: 142 mmol/L (ref 135–146)
TOTAL PROTEIN: 7.2 g/dL (ref 6.1–8.1)

## 2017-07-31 LAB — CBC
HCT: 39.9 % (ref 35.0–45.0)
Hemoglobin: 13.6 g/dL (ref 11.7–15.5)
MCH: 32.3 pg (ref 27.0–33.0)
MCHC: 34.1 g/dL (ref 32.0–36.0)
MCV: 94.8 fL (ref 80.0–100.0)
MPV: 12 fL (ref 7.5–12.5)
PLATELETS: 223 10*3/uL (ref 140–400)
RBC: 4.21 10*6/uL (ref 3.80–5.10)
RDW: 12.6 % (ref 11.0–15.0)
WBC: 5.6 10*3/uL (ref 3.8–10.8)

## 2017-07-31 LAB — LIPID PANEL
Cholesterol: 141 mg/dL (ref <200)
HDL: 65 mg/dL (ref >50)
LDL CHOLESTEROL (CALC): 49 mg/dL
Non-HDL Cholesterol (Calc): 76 mg/dL (calc) (ref <130)
Total CHOL/HDL Ratio: 2.2 (calc) (ref <5.0)
Triglycerides: 197 mg/dL — ABNORMAL HIGH (ref <150)

## 2017-07-31 LAB — TSH: TSH: 4.47 mIU/L (ref 0.40–4.50)

## 2017-08-03 ENCOUNTER — Encounter: Payer: Self-pay | Admitting: Family Medicine

## 2017-08-03 ENCOUNTER — Ambulatory Visit (INDEPENDENT_AMBULATORY_CARE_PROVIDER_SITE_OTHER): Payer: Medicare Other | Admitting: Family Medicine

## 2017-08-03 VITALS — BP 115/76 | HR 90 | Ht 66.0 in | Wt 177.0 lb

## 2017-08-03 DIAGNOSIS — M1711 Unilateral primary osteoarthritis, right knee: Secondary | ICD-10-CM

## 2017-08-03 DIAGNOSIS — M25561 Pain in right knee: Secondary | ICD-10-CM | POA: Diagnosis not present

## 2017-08-03 DIAGNOSIS — G8929 Other chronic pain: Secondary | ICD-10-CM

## 2017-08-03 NOTE — Patient Instructions (Signed)
Thank you for coming in today. Return in 1 week.  Call or go to the ER if you develop a large red swollen joint with extreme pain or oozing puss.

## 2017-08-03 NOTE — Progress Notes (Signed)
Patient presents to clinic today for previously arranged Orthovisc injection of the right knee. 1/4  Procedure: Real-time Ultrasound Guided Injection of right knee orthovisc  Device: GE Logiq E   Images permanently stored and available for review in the ultrasound unit. Verbal informed consent obtained.  Discussed risks and benefits of procedure. Warned about infection bleeding damage to structures skin hypopigmentation and fat atrophy among others. Patient expresses understanding and agreement Time-out conducted.   Noted no overlying erythema, induration, or other signs of local infection.   Skin prepped in a sterile fashion.   Local anesthesia: Topical Ethyl chloride.   With sterile technique and under real time ultrasound guidance:  orthovisc injected easily.   Completed without difficulty    Advised to call if fevers/chills, erythema, induration, drainage, or persistent bleeding.   Images permanently stored and available for review in the ultrasound unit.  Impression: Technically successful ultrasound guided injection.   LOT NUMBER: [id number]  Return in 1 week for injection 2/4

## 2017-08-10 ENCOUNTER — Encounter: Payer: Self-pay | Admitting: Family Medicine

## 2017-08-10 ENCOUNTER — Ambulatory Visit (INDEPENDENT_AMBULATORY_CARE_PROVIDER_SITE_OTHER): Payer: Medicare Other | Admitting: Family Medicine

## 2017-08-10 VITALS — BP 136/84 | HR 80 | Wt 175.0 lb

## 2017-08-10 DIAGNOSIS — G8929 Other chronic pain: Secondary | ICD-10-CM

## 2017-08-10 DIAGNOSIS — M1711 Unilateral primary osteoarthritis, right knee: Secondary | ICD-10-CM | POA: Diagnosis not present

## 2017-08-10 DIAGNOSIS — M25561 Pain in right knee: Secondary | ICD-10-CM

## 2017-08-10 NOTE — Progress Notes (Signed)
Patient presents to clinic today for previously arranged Orthovisc injection of the right knee. 2/4  Procedure: Real-time Ultrasound Guided Injection of right knee orthovisc  Device: GE Logiq E   Images permanently stored and available for review in the ultrasound unit. Verbal informed consent obtained.  Discussed risks and benefits of procedure. Warned about infection bleeding damage to structures skin hypopigmentation and fat atrophy among others. Patient expresses understanding and agreement Time-out conducted.   Noted no overlying erythema, induration, or other signs of local infection.   Skin prepped in a sterile fashion.   Local anesthesia: Topical Ethyl chloride.   With sterile technique and under real time ultrasound guidance:  orthovisc injected easily.   Completed without difficulty    Advised to call if fevers/chills, erythema, induration, drainage, or persistent bleeding.   Images permanently stored and available for review in the ultrasound unit.  Impression: Technically successful ultrasound guided injection.   LOT NUMBER: [id number]  Return in 1 week for injection 3/4

## 2017-08-14 DIAGNOSIS — M1711 Unilateral primary osteoarthritis, right knee: Secondary | ICD-10-CM | POA: Diagnosis not present

## 2017-08-14 DIAGNOSIS — M25561 Pain in right knee: Secondary | ICD-10-CM | POA: Diagnosis not present

## 2017-08-17 ENCOUNTER — Encounter: Payer: Self-pay | Admitting: Family Medicine

## 2017-08-17 ENCOUNTER — Ambulatory Visit (INDEPENDENT_AMBULATORY_CARE_PROVIDER_SITE_OTHER): Payer: Medicare Other | Admitting: Family Medicine

## 2017-08-17 VITALS — BP 131/78 | HR 78 | Ht 66.0 in | Wt 174.0 lb

## 2017-08-17 DIAGNOSIS — G8929 Other chronic pain: Secondary | ICD-10-CM

## 2017-08-17 DIAGNOSIS — M1711 Unilateral primary osteoarthritis, right knee: Secondary | ICD-10-CM

## 2017-08-17 DIAGNOSIS — M25561 Pain in right knee: Secondary | ICD-10-CM

## 2017-08-17 NOTE — Progress Notes (Signed)
Patient presents to clinic today for previously arranged Orthovisc injection of the right knee. 3/4  Procedure: Real-time Ultrasound Guided Injection of right knee orthovisc  Device: GE Logiq E   Images permanently stored and available for review in the ultrasound unit. Verbal informed consent obtained.  Discussed risks and benefits of procedure. Warned about infection bleeding damage to structures skin hypopigmentation and fat atrophy among others. Patient expresses understanding and agreement Time-out conducted.   Noted no overlying erythema, induration, or other signs of local infection.   Skin prepped in a sterile fashion.   Local anesthesia: Topical Ethyl chloride.   With sterile technique and under real time ultrasound guidance:  orthovisc injected easily.   Completed without difficulty    Advised to call if fevers/chills, erythema, induration, drainage, or persistent bleeding.   Images permanently stored and available for review in the ultrasound unit.  Impression: Technically successful ultrasound guided injection.   LOT NUMBER: [id number]  Return in 1 week for injection 4/4

## 2017-08-17 NOTE — Patient Instructions (Signed)
Thank you for coming in today. Recheck next week as we discussed.

## 2017-08-25 ENCOUNTER — Ambulatory Visit (INDEPENDENT_AMBULATORY_CARE_PROVIDER_SITE_OTHER): Payer: Medicare Other | Admitting: Family Medicine

## 2017-08-25 ENCOUNTER — Encounter: Payer: Self-pay | Admitting: Family Medicine

## 2017-08-25 VITALS — BP 115/78 | HR 83 | Wt 175.0 lb

## 2017-08-25 DIAGNOSIS — M1711 Unilateral primary osteoarthritis, right knee: Secondary | ICD-10-CM | POA: Diagnosis not present

## 2017-08-25 DIAGNOSIS — M25561 Pain in right knee: Secondary | ICD-10-CM | POA: Diagnosis not present

## 2017-08-25 DIAGNOSIS — G8929 Other chronic pain: Secondary | ICD-10-CM

## 2017-08-25 NOTE — Progress Notes (Signed)
Patient presents to clinic today for previously arranged Orthovisc injection of the right knee. 4/4  Procedure: Real-time Ultrasound Guided Injection of right knee orthovisc  Device: GE Logiq E   Images permanently stored and available for review in the ultrasound unit. Verbal informed consent obtained.  Discussed risks and benefits of procedure. Warned about infection bleeding damage to structures skin hypopigmentation and fat atrophy among others. Patient expresses understanding and agreement Time-out conducted.   Noted no overlying erythema, induration, or other signs of local infection.   Skin prepped in a sterile fashion.   Local anesthesia: Topical Ethyl chloride.   With sterile technique and under real time ultrasound guidance:  orthovisc injected easily.   Completed without difficulty    Advised to call if fevers/chills, erythema, induration, drainage, or persistent bleeding.   Images permanently stored and available for review in the ultrasound unit.  Impression: Technically successful ultrasound guided injection.

## 2017-08-25 NOTE — Patient Instructions (Signed)
Thank you for coming in today. Continue PT  Recheck with me as needed.

## 2017-08-27 DIAGNOSIS — H11001 Unspecified pterygium of right eye: Secondary | ICD-10-CM | POA: Diagnosis not present

## 2017-08-27 DIAGNOSIS — Z961 Presence of intraocular lens: Secondary | ICD-10-CM | POA: Diagnosis not present

## 2017-08-27 DIAGNOSIS — H04123 Dry eye syndrome of bilateral lacrimal glands: Secondary | ICD-10-CM | POA: Diagnosis not present

## 2017-08-27 DIAGNOSIS — D23112 Other benign neoplasm of skin of right lower eyelid, including canthus: Secondary | ICD-10-CM | POA: Diagnosis not present

## 2017-08-27 DIAGNOSIS — D23121 Other benign neoplasm of skin of left upper eyelid, including canthus: Secondary | ICD-10-CM | POA: Diagnosis not present

## 2017-08-27 DIAGNOSIS — H26493 Other secondary cataract, bilateral: Secondary | ICD-10-CM | POA: Diagnosis not present

## 2017-08-27 DIAGNOSIS — H527 Unspecified disorder of refraction: Secondary | ICD-10-CM | POA: Diagnosis not present

## 2017-08-27 DIAGNOSIS — H02831 Dermatochalasis of right upper eyelid: Secondary | ICD-10-CM | POA: Diagnosis not present

## 2017-08-27 DIAGNOSIS — H43811 Vitreous degeneration, right eye: Secondary | ICD-10-CM | POA: Diagnosis not present

## 2017-08-27 DIAGNOSIS — H02834 Dermatochalasis of left upper eyelid: Secondary | ICD-10-CM | POA: Diagnosis not present

## 2017-08-28 DIAGNOSIS — M1711 Unilateral primary osteoarthritis, right knee: Secondary | ICD-10-CM | POA: Diagnosis not present

## 2017-08-28 DIAGNOSIS — M25561 Pain in right knee: Secondary | ICD-10-CM | POA: Diagnosis not present

## 2017-09-01 DIAGNOSIS — M25561 Pain in right knee: Secondary | ICD-10-CM | POA: Diagnosis not present

## 2017-09-01 DIAGNOSIS — M1711 Unilateral primary osteoarthritis, right knee: Secondary | ICD-10-CM | POA: Diagnosis not present

## 2017-09-04 DIAGNOSIS — M1711 Unilateral primary osteoarthritis, right knee: Secondary | ICD-10-CM | POA: Diagnosis not present

## 2017-09-04 DIAGNOSIS — M25561 Pain in right knee: Secondary | ICD-10-CM | POA: Diagnosis not present

## 2017-09-07 DIAGNOSIS — M25561 Pain in right knee: Secondary | ICD-10-CM | POA: Diagnosis not present

## 2017-09-07 DIAGNOSIS — M1711 Unilateral primary osteoarthritis, right knee: Secondary | ICD-10-CM | POA: Diagnosis not present

## 2017-09-08 DIAGNOSIS — L570 Actinic keratosis: Secondary | ICD-10-CM | POA: Diagnosis not present

## 2017-09-08 DIAGNOSIS — D485 Neoplasm of uncertain behavior of skin: Secondary | ICD-10-CM | POA: Diagnosis not present

## 2017-09-08 DIAGNOSIS — Z08 Encounter for follow-up examination after completed treatment for malignant neoplasm: Secondary | ICD-10-CM | POA: Diagnosis not present

## 2017-09-08 DIAGNOSIS — Z85820 Personal history of malignant melanoma of skin: Secondary | ICD-10-CM | POA: Diagnosis not present

## 2017-09-08 DIAGNOSIS — L820 Inflamed seborrheic keratosis: Secondary | ICD-10-CM | POA: Diagnosis not present

## 2017-09-08 DIAGNOSIS — Z85828 Personal history of other malignant neoplasm of skin: Secondary | ICD-10-CM | POA: Diagnosis not present

## 2017-09-11 DIAGNOSIS — M25561 Pain in right knee: Secondary | ICD-10-CM | POA: Diagnosis not present

## 2017-09-11 DIAGNOSIS — M1711 Unilateral primary osteoarthritis, right knee: Secondary | ICD-10-CM | POA: Diagnosis not present

## 2017-09-15 DIAGNOSIS — M1711 Unilateral primary osteoarthritis, right knee: Secondary | ICD-10-CM | POA: Diagnosis not present

## 2017-09-15 DIAGNOSIS — M25561 Pain in right knee: Secondary | ICD-10-CM | POA: Diagnosis not present

## 2017-09-18 DIAGNOSIS — M1711 Unilateral primary osteoarthritis, right knee: Secondary | ICD-10-CM | POA: Diagnosis not present

## 2017-09-18 DIAGNOSIS — M25561 Pain in right knee: Secondary | ICD-10-CM | POA: Diagnosis not present

## 2017-09-22 DIAGNOSIS — M25561 Pain in right knee: Secondary | ICD-10-CM | POA: Diagnosis not present

## 2017-09-22 DIAGNOSIS — M1711 Unilateral primary osteoarthritis, right knee: Secondary | ICD-10-CM | POA: Diagnosis not present

## 2017-09-25 DIAGNOSIS — M1711 Unilateral primary osteoarthritis, right knee: Secondary | ICD-10-CM | POA: Diagnosis not present

## 2017-09-25 DIAGNOSIS — M25561 Pain in right knee: Secondary | ICD-10-CM | POA: Diagnosis not present

## 2017-09-30 DIAGNOSIS — M1711 Unilateral primary osteoarthritis, right knee: Secondary | ICD-10-CM | POA: Diagnosis not present

## 2017-09-30 DIAGNOSIS — M25561 Pain in right knee: Secondary | ICD-10-CM | POA: Diagnosis not present

## 2017-10-01 DIAGNOSIS — M1711 Unilateral primary osteoarthritis, right knee: Secondary | ICD-10-CM | POA: Diagnosis not present

## 2017-10-01 DIAGNOSIS — M25561 Pain in right knee: Secondary | ICD-10-CM | POA: Diagnosis not present

## 2017-10-06 DIAGNOSIS — M1711 Unilateral primary osteoarthritis, right knee: Secondary | ICD-10-CM | POA: Diagnosis not present

## 2017-10-06 DIAGNOSIS — M25561 Pain in right knee: Secondary | ICD-10-CM | POA: Diagnosis not present

## 2017-10-09 DIAGNOSIS — M1711 Unilateral primary osteoarthritis, right knee: Secondary | ICD-10-CM | POA: Diagnosis not present

## 2017-10-09 DIAGNOSIS — M25561 Pain in right knee: Secondary | ICD-10-CM | POA: Diagnosis not present

## 2017-11-22 DIAGNOSIS — Z23 Encounter for immunization: Secondary | ICD-10-CM | POA: Diagnosis not present

## 2017-12-15 ENCOUNTER — Telehealth: Payer: Self-pay

## 2017-12-15 NOTE — Telephone Encounter (Signed)
Scheduled pt for her Medicare Wellness Exam with Maudie Mercury -LPN

## 2017-12-15 NOTE — Telephone Encounter (Signed)
Typically we take care of this and other preventive care measures at annual physical.  Let schedule her for her Medicare wellness visit!

## 2017-12-15 NOTE — Telephone Encounter (Signed)
Pt called requesting referrals for mammogram & bone density testing. Thanks.

## 2017-12-16 NOTE — Progress Notes (Signed)
Subjective:   Jennifer Ray is a 77 y.o. female who presents for an Initial Medicare Annual Wellness Visit.  Review of Systems    No ROS.  Medicare Wellness Visit. Additional risk factors are reflected in the social history.  Cardiac Risk Factors include: dyslipidemia;hypertension;advanced age (>69men, >76 women) Sleep patterns: gets 7 1/2 hours a night to sleep. Wakes up one time during the night to urinate.   Home Safety/Smoke Alarms: Feels safe in home. Smoke alarms in place.  Living environment; Lives with daughter and cats in 2 story home. Stairs have hand rails. Shower is a step over shower but has grab bars in place.      Female:   Pap-  Aged out     Mammo- scheduled 01/06/2018    Dexa scan- scheduled 01/06/2018       [facility]-aged out Eye Exam- July 2019     Objective:    There were no vitals filed for this visit. There is no height or weight on file to calculate BMI.  Advanced Directives 12/28/2017  Does Patient Have a Medical Advance Directive? Yes  Type of Paramedic of [place];Living will  Does patient want to make changes to medical advance directive? Yes (MAU/Ambulatory/Procedural Areas - Information given)  Copy of [place] in Chart? No - copy requested    Current Medications (verified) Outpatient Encounter Medications as of 12/28/2017  Medication Sig  . aspirin 81 MG tablet Take 1 tablet (81 mg total) by mouth daily.  . diclofenac sodium (VOLTAREN) 1 % GEL Apply 4 g topically 4 (four) times daily. Knee DJD.  Marland Kitchen levothyroxine (SYNTHROID, LEVOTHROID) 25 MCG tablet Take 1 tablet (25 mcg total) by mouth daily before breakfast.  . losartan-hydrochlorothiazide (HYZAAR) 100-12.5 MG tablet Take 1 tablet by mouth daily.  . rosuvastatin (CRESTOR) 10 MG tablet Take 1 tablet (10 mg total) by mouth daily.   No facility-administered encounter medications on file as of 12/28/2017.     Allergies (verified) Patient has no known  allergies.   History: Past Medical History:  Diagnosis Date  . Hiatal hernia   . Hypercholesteremia   . Hypertension   . Hypothyroid   . Melanoma ([place])    removed   Past Surgical History:  Procedure Laterality Date  . TONSILLECTOMY AND ADENOIDECTOMY    . TUBAL LIGATION     Family History  Problem Relation Age of Onset  . Cancer Mother        Breast  . Heart attack Mother   . Cancer Sister        breast   Social History   Socioeconomic History  . Marital status: Divorced    Spouse name: Not on file  . Number of children: 2  . Years of education: 51  . Highest education level: 12th grade  Occupational History  . Occupation: retired    Comment: Catering manager  Social Needs  . Financial resource strain: Not hard at all  . Food insecurity:    Worry: Never true    Inability: Never true  . Transportation needs:    Medical: No    Non-medical: No  Tobacco Use  . Smoking status: Never Smoker  . Smokeless tobacco: Never Used  Substance and Sexual Activity  . Alcohol use: No  . Drug use: No  . Sexual activity: Not Currently  Lifestyle  . Physical activity:    Days per week: 7 days    Minutes per session: 60 min  .  Stress: Not at all  Relationships  . Social connections:    Talks on phone: More than three times a week    Gets together: Once a week    Attends religious service: Never    Active member of club or organization: Yes    Attends meetings of clubs or organizations: More than 4 times per year    Relationship status: Divorced  Other Topics Concern  . Not on file  Social History Narrative   Patient walks everyday nad does stairs everyday. Eats very healthy a lot of vegetables.    Tobacco Counseling Counseling given: Not Answered   Clinical Intake:  Pre-visit preparation completed: Yes  Pain : No/denies pain     Nutritional Risks: None Diabetes: No  How often do you need to have someone help you when you read instructions,  pamphlets, or other written materials from your doctor or pharmacy?: 1 - Never What is the last grade level you completed in school?: college 4 years  Interpreter Needed?: No  Information entered by :: Orlie Dakin, LPN   Activities of Daily Living In your present state of health, do you have any difficulty performing the following activities: 12/28/2017  Hearing? Y  Comment has hearing aids  Vision? N  Comment wears reading glasses to read  Difficulty concentrating or making decisions? N  Walking or climbing stairs? N  Dressing or bathing? N  Doing errands, shopping? N  Preparing Food and eating ? N  Using the Toilet? N  In the past six months, have you accidently leaked urine? N  Do you have problems with loss of bowel control? N  Managing your Medications? N  Managing your Finances? N  Housekeeping or managing your Housekeeping? N  Some recent data might be hidden     Immunizations and Health Maintenance Immunization History  Administered Date(s) Administered  . Tdap 07/30/2017   Health Maintenance Due  Topic Date Due  . DEXA SCAN  09/28/2004  . PNA vac Low Risk Adult (1 of 2 - PCV13) 09/28/2004  . INFLUENZA VACCINE  10/29/2017    Patient Care Team: Emeterio Reeve, DO as PCP - General (Osteopathic Medicine)  Indicate any recent Medical Services you may have received from other than [facility] providers in the past year (date may be approximate).     Assessment:   This is a routine wellness examination for [name]. Physical assessment deferred to PCP.   Hearing/Vision screen  Visual Acuity Screening   Right eye Left eye Both eyes  Without correction: 20/20 20/20 20/20   With correction:     Hearing Screening Comments: Whisper test done with 3 words but patient could not hear me. She does not have her hearing aids in today  Dietary issues and exercise activities discussed: Current Exercise Habits: Home exercise routine, Type of exercise: walking;Other - see  comments, Time (Minutes): 60, Frequency (Times/Week): 7, Weekly Exercise (Minutes/Week): 420, Intensity: Moderate, Exercise limited by: cardiac condition(s) Diet  Eats a lot of fresh fruits and vegetables. Eats chick peas and black beans for protein. Breakfast: oatmeal and an orange Lunch: [place] wrap with spinach Dinner:drinks smoothie with greens, coconut, banana and apples      Goals    . DIET - INCREASE WATER INTAKE     Drink at least 8 8oz. Bottles of water daily      Depression Screen PHQ 2/9 Scores 12/28/2017 07/30/2017  PHQ - 2 Score 0 0  PHQ- 9 Score - 0    Fall Risk  Fall Risk  12/28/2017  Falls in the past year? No    Is the patient's home free of loose throw rugs in walkways, pet beds, electrical cords, etc?   yes      Grab bars in the bathroom? yes      Handrails on the stairs?   yes      Adequate lighting?   yes  Cognitive Function:     6CIT Screen 12/28/2017  What Year? 0 points  What month? 0 points  What time? 0 points  Count back from 20 0 points  Months in reverse 0 points  Repeat phrase 0 points  Total Score 0    Screening Tests Health Maintenance  Topic Date Due  . DEXA SCAN  09/28/2004  . PNA vac Low Risk Adult (1 of 2 - PCV13) 09/28/2004  . INFLUENZA VACCINE  10/29/2017  . TETANUS/TDAP  07/31/2027     Plan:  Please schedule your next medicare wellness visit with me in 1 yr.  Ms. Leth , Thank you for taking time to come for your Medicare Wellness Visit. I appreciate your ongoing commitment to your health goals. Please review the following plan we discussed and let me know if I can assist you in the future.   Continue doing brain stimulating activities (puzzles, reading, adult coloring books, staying active) to keep memory sharp.   Bring a copy of your living will and/or healthcare power of attorney to your next office visit.   These are the goals we discussed: Goals    . DIET - INCREASE WATER INTAKE     Drink at least 8 8oz. Bottles of  water daily       This is a list of the screening recommended for you and due dates:  Health Maintenance  Topic Date Due  . DEXA scan (bone density measurement)  09/28/2004  . Pneumonia vaccines (1 of 2 - PCV13) 09/28/2004  . Flu Shot  10/29/2017  . Tetanus Vaccine  07/31/2027     I have personally reviewed and noted the following in the patient's chart:   . Medical and social history . Use of alcohol, tobacco or illicit drugs  . Current medications and supplements . Functional ability and status . Nutritional status . Physical activity . Advanced directives . List of other physicians . Hospitalizations, surgeries, and ER visits in previous 12 months . Vitals . Screenings to include cognitive, depression, and falls . Referrals and appointments  In addition, I have reviewed and discussed with patient certain preventive protocols, quality metrics, and best practice recommendations. A written personalized care plan for preventive services as well as general preventive health recommendations were provided to patient.     Joanne Chars, LPN   10/30/6551

## 2017-12-18 NOTE — Telephone Encounter (Signed)
Noted  

## 2017-12-28 ENCOUNTER — Ambulatory Visit (INDEPENDENT_AMBULATORY_CARE_PROVIDER_SITE_OTHER): Payer: Medicare Other | Admitting: *Deleted

## 2017-12-28 VITALS — BP 113/68 | HR 66 | Ht 66.0 in | Wt 172.0 lb

## 2017-12-28 DIAGNOSIS — Z0000 Encounter for general adult medical examination without abnormal findings: Secondary | ICD-10-CM

## 2017-12-28 NOTE — Patient Instructions (Addendum)
Please schedule your next medicare wellness visit with me in 1 yr.  [name] , Thank you for taking time to come for your Medicare Wellness Visit. I appreciate your ongoing commitment to your health goals. Please review the following plan we discussed and let me know if I can assist you in the future.   Health Maintenance for Postmenopausal Women Menopause is a normal process in which your reproductive ability comes to an end. This process happens gradually over a span of months to years, usually between the ages of 43 and 15. Menopause is complete when you have missed 12 consecutive menstrual periods. It is important to talk with your health care provider about some of the most common conditions that affect postmenopausal women, such as heart disease, cancer, and bone loss (osteoporosis). Adopting a healthy lifestyle and getting preventive care can help to promote your health and wellness. Those actions can also lower your chances of developing some of these common conditions. What should I know about menopause? During menopause, you may experience a number of symptoms, such as:  Moderate-to-severe hot flashes.  Night sweats.  Decrease in sex drive.  Mood swings.  Headaches.  Tiredness.  Irritability.  Memory problems.  Insomnia.  Choosing to treat or not to treat menopausal changes is an individual decision that you make with your health care provider. What should I know about hormone replacement therapy and supplements? Hormone therapy products are effective for treating symptoms that are associated with menopause, such as hot flashes and night sweats. Hormone replacement carries certain risks, especially as you become older. If you are thinking about using estrogen or estrogen with progestin treatments, discuss the benefits and risks with your health care provider. What should I know about heart disease and stroke? Heart disease, heart attack, and stroke become more likely as  you age. This may be due, in part, to the hormonal changes that your body experiences during menopause. These can affect how your body processes dietary fats, triglycerides, and cholesterol. Heart attack and stroke are both medical emergencies. There are many things that you can do to help prevent heart disease and stroke:  Have your blood pressure checked at least every 1-2 years. High blood pressure causes heart disease and increases the risk of stroke.  If you are 78-106 years old, ask your health care provider if you should take aspirin to prevent a heart attack or a stroke.  Do not use any tobacco products, including cigarettes, chewing tobacco, or electronic cigarettes. If you need help quitting, ask your health care provider.  It is important to eat a healthy diet and maintain a healthy weight. ? Be sure to include plenty of vegetables, fruits, low-fat dairy products, and lean protein. ? Avoid eating foods that are high in solid fats, added sugars, or salt (sodium).  Get regular exercise. This is one of the most important things that you can do for your health. ? Try to exercise for at least 150 minutes each week. The type of exercise that you do should increase your heart rate and make you sweat. This is known as moderate-intensity exercise. ? Try to do strengthening exercises at least twice each week. Do these in addition to the moderate-intensity exercise.  Know your numbers.Ask your health care provider to check your cholesterol and your blood glucose. Continue to have your blood tested as directed by your health care provider.  What should I know about cancer screening? There are several types of cancer. Take the following  steps to reduce your risk and to catch any cancer development as early as possible. Breast Cancer  Practice breast self-awareness. ? This means understanding how your breasts normally appear and feel. ? It also means doing regular breast self-exams. Let your  health care provider know about any changes, no matter how small.  If you are 22 or older, have a clinician do a breast exam (clinical breast exam or CBE) every year. Depending on your age, family history, and medical history, it may be recommended that you also have a yearly breast X-ray (mammogram).  If you have a family history of breast cancer, talk with your health care provider about genetic screening.  If you are at high risk for breast cancer, talk with your health care provider about having an MRI and a mammogram every year.  Breast cancer (BRCA) gene test is recommended for women who have family members with BRCA-related cancers. Results of the assessment will determine the need for genetic counseling and BRCA1 and for BRCA2 testing. BRCA-related cancers include these types: ? Breast. This occurs in males or females. ? Ovarian. ? Tubal. This may also be called fallopian tube cancer. ? Cancer of the abdominal or pelvic lining (peritoneal cancer). ? Prostate. ? Pancreatic.  Cervical, Uterine, and Ovarian Cancer Your health care provider may recommend that you be screened regularly for cancer of the pelvic organs. These include your ovaries, uterus, and vagina. This screening involves a pelvic exam, which includes checking for microscopic changes to the surface of your cervix (Pap test).  For women ages 21-65, health care providers may recommend a pelvic exam and a Pap test every three years. For women ages 92-65, they may recommend the Pap test and pelvic exam, combined with testing for human papilloma virus (HPV), every five years. Some types of HPV increase your risk of cervical cancer. Testing for HPV may also be done on women of any age who have unclear Pap test results.  Other health care providers may not recommend any screening for nonpregnant women who are considered low risk for pelvic cancer and have no symptoms. Ask your health care provider if a screening pelvic exam is right  for you.  If you have had past treatment for cervical cancer or a condition that could lead to cancer, you need Pap tests and screening for cancer for at least 20 years after your treatment. If Pap tests have been discontinued for you, your risk factors (such as having a new sexual partner) need to be reassessed to determine if you should start having screenings again. Some women have medical problems that increase the chance of getting cervical cancer. In these cases, your health care provider may recommend that you have screening and Pap tests more often.  If you have a family history of uterine cancer or ovarian cancer, talk with your health care provider about genetic screening.  If you have vaginal bleeding after reaching menopause, tell your health care provider.  There are currently no reliable tests available to screen for ovarian cancer.  Lung Cancer Lung cancer screening is recommended for adults 47-57 years old who are at high risk for lung cancer because of a history of smoking. A yearly low-dose CT scan of the lungs is recommended if you:  Currently smoke.  Have a history of at least 30 pack-years of smoking and you currently smoke or have quit within the past 15 years. A pack-year is smoking an average of one pack of cigarettes per day for  one year.  Yearly screening should:  Continue until it has been 15 years since you quit.  Stop if you develop a health problem that would prevent you from having lung cancer treatment.  Colorectal Cancer  This type of cancer can be detected and can often be prevented.  Routine colorectal cancer screening usually begins at age 23 and continues through age 72.  If you have risk factors for colon cancer, your health care provider may recommend that you be screened at an earlier age.  If you have a family history of colorectal cancer, talk with your health care provider about genetic screening.  Your health care provider may also  recommend using home test kits to check for hidden blood in your stool.  A small camera at the end of a tube can be used to examine your colon directly (sigmoidoscopy or colonoscopy). This is done to check for the earliest forms of colorectal cancer.  Direct examination of the colon should be repeated every 5-10 years until age 89. However, if early forms of precancerous polyps or small growths are found or if you have a family history or genetic risk for colorectal cancer, you may need to be screened more often.  Skin Cancer  Check your skin from head to toe regularly.  Monitor any moles. Be sure to tell your health care provider: ? About any new moles or changes in moles, especially if there is a change in a mole's shape or color. ? If you have a mole that is larger than the size of a pencil eraser.  If any of your family members has a history of skin cancer, especially at a young age, talk with your health care provider about genetic screening.  Always use sunscreen. Apply sunscreen liberally and repeatedly throughout the day.  Whenever you are outside, protect yourself by wearing long sleeves, pants, a wide-brimmed hat, and sunglasses.  What should I know about osteoporosis? Osteoporosis is a condition in which bone destruction happens more quickly than new bone creation. After menopause, you may be at an increased risk for osteoporosis. To help prevent osteoporosis or the bone fractures that can happen because of osteoporosis, the following is recommended:  If you are 56-47 years old, get at least 1,000 mg of calcium and at least 600 mg of vitamin D per day.  If you are older than age 50 but younger than age 35, get at least 1,200 mg of calcium and at least 600 mg of vitamin D per day.  If you are older than age 53, get at least 1,200 mg of calcium and at least 800 mg of vitamin D per day.  Smoking and excessive alcohol intake increase the risk of osteoporosis. Eat foods that are  rich in calcium and vitamin D, and do weight-bearing exercises several times each week as directed by your health care provider. What should I know about how menopause affects my mental health? Depression may occur at any age, but it is more common as you become older. Common symptoms of depression include:  Low or sad mood.  Changes in sleep patterns.  Changes in appetite or eating patterns.  Feeling an overall lack of motivation or enjoyment of activities that you previously enjoyed.  Frequent crying spells.  Talk with your health care provider if you think that you are experiencing depression. What should I know about immunizations? It is important that you get and maintain your immunizations. These include:  Tetanus, diphtheria, and pertussis (Tdap) booster  vaccine.  Influenza every year before the flu season begins.  Pneumonia vaccine.  Shingles vaccine.  Your health care provider may also recommend other immunizations. This information is not intended to replace advice given to you by your health care provider. Make sure you discuss any questions you have with your health care provider. Document Released: 05/09/2005 Document Revised: 10/05/2015 Document Reviewed: 12/19/2014 Elsevier Interactive Patient Education  2018 [place] doing brain stimulating activities (puzzles, reading, adult coloring books, staying active) to keep memory sharp.   Bring a copy of your living will and/or healthcare power of attorney to your next office visit.   These are the goals we discussed: Goals    . DIET - INCREASE WATER INTAKE     Drink at least 8 8oz. Bottles of water daily

## 2018-01-05 ENCOUNTER — Other Ambulatory Visit: Payer: Self-pay | Admitting: Osteopathic Medicine

## 2018-01-05 DIAGNOSIS — Z780 Asymptomatic menopausal state: Secondary | ICD-10-CM

## 2018-01-06 ENCOUNTER — Other Ambulatory Visit: Payer: Medicare Other

## 2018-01-06 ENCOUNTER — Ambulatory Visit: Payer: Medicare Other

## 2018-01-13 ENCOUNTER — Ambulatory Visit (INDEPENDENT_AMBULATORY_CARE_PROVIDER_SITE_OTHER): Payer: Medicare Other

## 2018-01-13 ENCOUNTER — Encounter: Payer: Self-pay | Admitting: Osteopathic Medicine

## 2018-01-13 DIAGNOSIS — Z1231 Encounter for screening mammogram for malignant neoplasm of breast: Secondary | ICD-10-CM

## 2018-01-13 DIAGNOSIS — Z780 Asymptomatic menopausal state: Secondary | ICD-10-CM | POA: Diagnosis not present

## 2018-01-13 DIAGNOSIS — M810 Age-related osteoporosis without current pathological fracture: Secondary | ICD-10-CM

## 2018-01-13 DIAGNOSIS — Z1239 Encounter for other screening for malignant neoplasm of breast: Secondary | ICD-10-CM

## 2018-01-15 ENCOUNTER — Telehealth: Payer: Self-pay

## 2018-01-15 NOTE — Telephone Encounter (Signed)
As per pt, she would like to begin injections for osteoporosis dx. Pls advise, thanks.

## 2018-01-18 MED ORDER — DENOSUMAB 60 MG/ML ~~LOC~~ SOSY: 60.0000 mg | PREFILLED_SYRINGE | SUBCUTANEOUS | 1 refills | Status: DC

## 2018-01-18 NOTE — Telephone Encounter (Signed)
Order is in for Prolia, can we see if we can get this approved?  Thank you

## 2018-01-18 NOTE — Telephone Encounter (Signed)
Prolia ordered. Routing to see is PA is required.

## 2018-01-22 NOTE — Telephone Encounter (Signed)
Received fax that patient is covered and she will have a percentage she will pay and she is agreeable to estimate.

## 2018-02-01 ENCOUNTER — Encounter: Payer: Self-pay | Admitting: Osteopathic Medicine

## 2018-02-01 ENCOUNTER — Ambulatory Visit (INDEPENDENT_AMBULATORY_CARE_PROVIDER_SITE_OTHER): Payer: Medicare Other | Admitting: Osteopathic Medicine

## 2018-02-01 ENCOUNTER — Ambulatory Visit (INDEPENDENT_AMBULATORY_CARE_PROVIDER_SITE_OTHER): Payer: Medicare Other

## 2018-02-01 VITALS — BP 138/77 | HR 83 | Temp 98.2°F | Wt 171.1 lb

## 2018-02-01 DIAGNOSIS — E039 Hypothyroidism, unspecified: Secondary | ICD-10-CM

## 2018-02-01 DIAGNOSIS — E7800 Pure hypercholesterolemia, unspecified: Secondary | ICD-10-CM | POA: Diagnosis not present

## 2018-02-01 DIAGNOSIS — I10 Essential (primary) hypertension: Secondary | ICD-10-CM

## 2018-02-01 DIAGNOSIS — M810 Age-related osteoporosis without current pathological fracture: Secondary | ICD-10-CM

## 2018-02-01 DIAGNOSIS — M79672 Pain in left foot: Secondary | ICD-10-CM

## 2018-02-01 DIAGNOSIS — Z0000 Encounter for general adult medical examination without abnormal findings: Secondary | ICD-10-CM | POA: Diagnosis not present

## 2018-02-01 DIAGNOSIS — M7732 Calcaneal spur, left foot: Secondary | ICD-10-CM

## 2018-02-01 NOTE — Progress Notes (Signed)
HPI: Jennifer Ray is a 77 y.o. female who  has a past medical history of Hiatal hernia, Hypercholesteremia, Hypertension, Hypothyroid, and Melanoma ([place]).  she presents to [facility] today, 02/01/18,  for chief complaint of: Follow-up chronic medical issues: see headings   Hypothyroid  - stable on current meds, no palpitations, fatigue, hair/skin changes.   HTN - stable on current meds   HLD - stable on current meds  GERD w/ history hiatal hernia - no antacids needed at this time  Cataracts bilateral - no other vision problems   Heel pain - new problem.  Plantar aspect of foot toward the heel, has been bothering her on and off worse over the past week, was concerned it might be plantar fasciitis show she has tried icing it and if you home exercise.  Her daughter is concerned it might be gout, she has no history of gout.  Exacerbated by walking.  Helped by resting.  No over-the-counter medicines tried yet.  Preventive: Last Mammo 2018, last colonoscopy 2012. Gets annual flu shots. Reports she has had shingles shot. PNA vax q3 years per patient (?) she states these were done at [facility]         Past medical, surgical, social and family history reviewed:  Patient Active Problem List   Diagnosis Date Noted  . Osteoporosis 01/13/2018  . Right knee DJD 07/28/2017  . Right knee pain 03/09/2015  . Hypertension   . Hypercholesteremia   . Melanoma ([place])   . Hypothyroid   . Hiatal hernia     Past Surgical History:  Procedure Laterality Date  . TONSILLECTOMY AND ADENOIDECTOMY    . TUBAL LIGATION      Social History   Tobacco Use  . Smoking status: Never Smoker  . Smokeless tobacco: Never Used  Substance Use Topics  . Alcohol use: No    Family History  Problem Relation Age of Onset  . Cancer Mother        Breast  . Heart attack Mother   . Cancer Sister        breast     Current medication list and allergy/intolerance  information reviewed:    Current Outpatient Medications  Medication Sig Dispense Refill  . aspirin 81 MG tablet Take 1 tablet (81 mg total) by mouth daily. 90 tablet 3  . denosumab (PROLIA) 60 MG/ML SOSY injection Inject 60 mg into the skin every 6 (six) months. Administer in upper arm, thigh, or abdomen 1 Syringe 1  . diclofenac sodium (VOLTAREN) 1 % GEL Apply 4 g topically 4 (four) times daily. Knee DJD. 100 g 11  . levothyroxine (SYNTHROID, LEVOTHROID) 25 MCG tablet Take 1 tablet (25 mcg total) by mouth daily before breakfast. 90 tablet 3  . losartan-hydrochlorothiazide (HYZAAR) 100-12.5 MG tablet Take 1 tablet by mouth daily. 90 tablet 3  . rosuvastatin (CRESTOR) 10 MG tablet Take 1 tablet (10 mg total) by mouth daily. 90 tablet 3   No current facility-administered medications for this visit.     No Known Allergies    Review of Systems:  Constitutional:  No  fever, no chills, No recent illness, No unintentional weight changes. No significant fatigue.   HEENT: No  headache, no vision change, no hearing change, No sore throat, No  sinus pressure  Cardiac: No  chest pain, No  pressure, No palpitations, No  Orthopnea  Respiratory:  No  shortness of breath. No  Cough  Gastrointestinal: No  abdominal pain,  No  nausea, No  vomiting,  No  blood in stool, No  diarrhea, No  constipation   Musculoskeletal: +new myalgia/arthralgia  Skin: No  Rash, No other wounds/concerning lesions  Genitourinary: No  incontinence, No  abnormal genital bleeding, No abnormal genital discharge  Hem/Onc: No  easy bruising/bleeding, No  abnormal lymph node  Endocrine: No cold intolerance,  No heat intolerance. No polyuria/polydipsia/polyphagia   Neurologic: No  weakness, No  dizziness, No  slurred speech/focal weakness/facial droop  Psychiatric: No  concerns with depression, No  concerns with anxiety  Exam:  BP 138/77 (BP Location: Left Arm, Patient Position: Sitting, Cuff Size: Large)   Pulse 83    Temp 98.2 F (36.8 C) (Oral)   Wt 171 lb 1.6 oz (77.6 kg)   BMI 27.62 kg/m   Constitutional: VS see above. General Appearance: alert, well-developed, well-nourished, NAD  Eyes: Normal lids and conjunctive, non-icteric sclera  Ears, Nose, Mouth, Throat: MMM, Normal external inspection ears/nares/mouth/lips/gums. TM normal bilaterally. Pharynx/tonsils no erythema, no exudate. Nasal mucosa normal.   Neck: No masses, trachea midline. No thyroid enlargement. No tenderness/mass appreciated. No lymphadenopathy  Respiratory: Normal respiratory effort. no wheeze, no rhonchi, no rales  Cardiovascular: S1/S2 normal, no murmur, no rub/gallop auscultated. RRR. No lower extremity edema.   Gastrointestinal: Nontender, no masses. No hepatomegaly, no splenomegaly. No hernia appreciated. Bowel sounds normal. Rectal exam deferred.   Musculoskeletal: Gait normal. No clubbing/cyanosis of digits.   Left heel examination: No ecchymoses or edema, skin appears normal.  Tenderness at base of calcaneus at attachment of plantar fascia, normal range of motion to ankle dorsiflexion/plantar flexion, inversion and eversion.  Normal sensation and strength in lower foot including all toes.  Neurological: Normal balance/coordination. No tremor.   Skin: warm, dry, intact. No rash/ulcer. No concerning nevi or subq nodules on limited exam.   Psychiatric: Normal judgment/insight. Normal mood and affect. Oriented x3.    Dg Bone Density  Result Date: 01/13/2018 EXAM: DUAL X-RAY ABSORPTIOMETRY (DXA) FOR BONE MINERAL DENSITY IMPRESSION: Emeterio Reeve Your patient Kierria Feigenbaum completed a BMD test on 01/13/2018 using the [employer] (analysis version: 16.SP2) manufactured by [employer]. The following summarizes the results of our evaluation. PATIENT: Name: Kamani, Magnussen Patient ID:  [id number] Birth Date: [birth year] Height:     64.5 in. Weight:     168.4 lbs. Gender:      Female Measured:    01/13/2018 Indications: Advanced Age, Caucasian, Estrogen Deficiency, Lupus, Postmenopausal Fractures: Treatments: Fosamax (Alendronate), Levothyroxine ASSESSMENT: The BMD measured at Femur Neck Right is 0.679 g/cm2 with a T-score of -2.6. This patient is considered OSTEOPOROTIC according to [facility]) criteria. L2 was excluded due to degenerative changes. The scan quality is good. Site Region Measured Date Measured Age WHO YA BMD Classification T-score AP Spine L1-L4 (L2) 1/13/[age] 78.1 Normal -0.9 1.078 g/cm2 DualFemur Neck Right 1/13/[age] Osteoporosis -2.6 0.679 g/cm2 World Health Organization [facility]) criteria for post-menopausal, Caucasian Women: Normal        T-score at or above -1 SD Low Bone Mass T-score between -1 and -2.5 SD Osteoporosis  T-score at or below -2.5 SD RECOMMENDATION: 1. All patients should optimize calcium and vitamin D intake. 2. Consider FDA-approved medical therapies in postmenopausal women and men age 64 years and older, based on the following: a. A hip or vertebral(clinical or morphometric) fracture b. T-score < -2.5 at the femoral neck or spine after appropriate evaluation to exclude secondary causes c. Low bone mass (T-score  between -1.0 and -2.5 at the femoral neck or spine) and a 10-year probability of a hip fracture > 3% or 10 year probability of a major osteoporosis related fracture of > 20% based on the [place]-adapted WHO algorithm d. Clinician judgement and/or patient preferences may indicate treatment for people with 10-year fracture probabilities above or below these levels FOLLOW-UP: Patients with diagnosis of osteoporosis or at high risk for fracture should have regular bone mineral density tests. For patients eligible for Medicare, routine testing is allowed once every 2 years. The testing frequency can be increased to one year for patients who have rapidly progressing disease, those who are receiving or discontinuing medical therapy to restore  bone mass, or have additional risk factors. I have reviewed this report, and agree with the above findings Mark A. Thornton Papas, M.D. [facility] Electronically Signed   By: Lavonia Dana M.D.   On: 01/13/2018 16:04   Dg Foot Complete Left  Result Date: 02/01/2018 CLINICAL DATA:  77 year old female with left heel pain for the past week. Initial encounter. EXAM: LEFT FOOT - COMPLETE 3+ VIEW COMPARISON:  None. FINDINGS: Moderately large plantar spur. Small spur at the level of the Achilles tendon insertion site. Slightly high arch. No fracture or dislocation. IMPRESSION: 1. Moderately large plantar spur. 2. Small spur Achilles tendon insertion site. 3. Slightly high arch. Electronically Signed   By: Genia Del M.D.   On: 02/01/2018 13:48   Mm 3d Screen Breast Bilateral  Result Date: 01/19/2018 CLINICAL DATA:  Screening. EXAM: DIGITAL SCREENING BILATERAL MAMMOGRAM WITH TOMO AND CAD COMPARISON:  Previous exam(s). ACR Breast Density Category b: There are scattered areas of fibroglandular density. FINDINGS: There are no findings suspicious for malignancy. Images were processed with CAD. IMPRESSION: No mammographic evidence of malignancy. A result letter of this screening mammogram will be mailed directly to the patient. RECOMMENDATION: Screening mammogram in one year. (Code:SM-B-01Y) BI-RADS CATEGORY  1: Negative. Electronically Signed   By: Everlean Alstrom M.D.   On: 01/19/2018 07:31     ASSESSMENT/PLAN:   Essential hypertension - Plan: CBC, COMPLETE METABOLIC PANEL WITH GFR, Lipid panel, TSH  Hypothyroidism, unspecified type - Plan: CBC, COMPLETE METABOLIC PANEL WITH GFR, Lipid panel, TSH  Age-related osteoporosis without current pathological fracture - Plan: CBC, COMPLETE METABOLIC PANEL WITH GFR, Lipid panel, TSH  Hypercholesteremia - Plan: CBC, COMPLETE METABOLIC PANEL WITH GFR, Lipid panel, TSH  Annual physical exam - Plan: CBC, COMPLETE METABOLIC PANEL WITH GFR, Lipid panel, TSH  Pain  of left heel - Plan: DG Foot Complete Left    Patient Instructions   General Preventive Care  Most recent routine screening lipids/other labs: ordered today.  Tobacco: don't! Alcohol: responsible moderation is ok for most adults - if you have concerns about your alcohol intake, please talk to me! Recreational/Illicit Drugs: don't!  Exercise: as tolerated to reduce risk of cardiovascular disease and diabetes. Strength training will also prevent osteoporosis.   Mental health: if need for mental health care (medicines, counseling, other), or concerns about moods, please let me know!   Sexual health: if need for STD testing, or if concerns with libido/pain problems, please let me know!  Vaccines  Flu vaccine: recommended for almost everyone, every fall (by Halloween! Flu is scary!)  Shingles vaccine: Shingrix recommended after age 23 - we will call [facility] about records   Pneumonia vaccines: Prevnar and Pneumovax recommended after age 14, once, no boosters needed - will call [facility] about records   Tetanus booster: Tdap recommended every  10 years Cancer screenings   Colon cancer screening: not needed after age 42 as long as previous screening was negative   Breast cancer screening: mammogram annually is optional after age 53  Cervical cancer screening: Can stop Pap smears at age 46 or w/ hysterectomy as long as previous testing was normal.  Infection screenings . HIV: screening as needed . Gonorrhea/Chlamydia: screening as needed . Hepatitis C: recommended for anyone born 07-1964 . TB: certain at-risk populations, or depending on work requirements and/or travel history Other . Bone Density Test: done 01/13/18 - repeat in 2 years  . Advanced Directive: Living Will and/or Healthcare Power of Attorney recommended for all adults, regardless of age or health!        Immunization History  Administered Date(s) Administered  . Influenza-Unspecified 11/22/2017  . Tdap  07/30/2017       Visit summary with medication list and pertinent instructions was printed for patient to review. All questions at time of visit were answered - patient instructed to contact office with any additional concerns. ER/RTC precautions were reviewed with the patient.   Follow-up plan: Return in about 6 months (around 08/02/2018) for routine follow-up (call 1 wk before visit and I can make sure the orders are placed ahead of time) .    Please note: voice recognition software was used to produce this document, and typos may escape review. Please contact Dr. Sheppard Coil for any needed clarifications.

## 2018-02-01 NOTE — Patient Instructions (Addendum)
General Preventive Care  Most recent routine screening lipids/other labs: ordered today.  Tobacco: don't! Alcohol: responsible moderation is ok for most adults - if you have concerns about your alcohol intake, please talk to me! Recreational/Illicit Drugs: don't!  Exercise: as tolerated to reduce risk of cardiovascular disease and diabetes. Strength training will also prevent osteoporosis.   Mental health: if need for mental health care (medicines, counseling, other), or concerns about moods, please let me know!   Sexual health: if need for STD testing, or if concerns with libido/pain problems, please let me know!  Vaccines  Flu vaccine: recommended for almost everyone, every fall (by Halloween! Flu is scary!)  Shingles vaccine: Shingrix recommended after age 33 - we will call [facility] about records   Pneumonia vaccines: Prevnar and Pneumovax recommended after age 41, once, no boosters needed - will call [facility] about records   Tetanus booster: Tdap recommended every 10 years Cancer screenings   Colon cancer screening: not needed after age 41 as long as previous screening was negative   Breast cancer screening: mammogram annually is optional after age 16  Cervical cancer screening: Can stop Pap smears at age 38 or w/ hysterectomy as long as previous testing was normal.  Infection screenings . HIV: screening as needed . Gonorrhea/Chlamydia: screening as needed . Hepatitis C: recommended for anyone born 66-1966 . TB: certain at-risk populations, or depending on work requirements and/or travel history Other . Bone Density Test: done 01/13/18 - repeat in 2 years  . Advanced Directive: Living Will and/or Healthcare Power of Attorney recommended for all adults, regardless of age or health!        Immunization History  Administered Date(s) Administered  . Influenza-Unspecified 11/22/2017  . Tdap 07/30/2017

## 2018-02-02 LAB — CBC
HCT: 39.6 % (ref 35.0–45.0)
HEMOGLOBIN: 13.4 g/dL (ref 11.7–15.5)
MCH: 31.4 pg (ref 27.0–33.0)
MCHC: 33.8 g/dL (ref 32.0–36.0)
MCV: 92.7 fL (ref 80.0–100.0)
MPV: 11.4 fL (ref 7.5–12.5)
Platelets: 248 10*3/uL (ref 140–400)
RBC: 4.27 10*6/uL (ref 3.80–5.10)
RDW: 12.4 % (ref 11.0–15.0)
WBC: 6.5 10*3/uL (ref 3.8–10.8)

## 2018-02-02 LAB — LIPID PANEL
CHOLESTEROL: 128 mg/dL (ref <200)
HDL: 51 mg/dL (ref >50)
LDL CHOLESTEROL (CALC): 52 mg/dL
NON-HDL CHOLESTEROL (CALC): 77 mg/dL (ref <130)
Total CHOL/HDL Ratio: 2.5 (calc) (ref <5.0)
Triglycerides: 177 mg/dL — ABNORMAL HIGH (ref <150)

## 2018-02-02 LAB — COMPLETE METABOLIC PANEL WITH GFR
AG Ratio: 1.6 (calc) (ref 1.0–2.5)
ALBUMIN MSPROF: 4.4 g/dL (ref 3.6–5.1)
ALKALINE PHOSPHATASE (APISO): 65 U/L (ref 33–130)
ALT: 14 U/L (ref 6–29)
AST: 23 U/L (ref 10–35)
BUN / CREAT RATIO: 21 (calc) (ref 6–22)
BUN: 21 mg/dL (ref 7–25)
CO2: 27 mmol/L (ref 20–32)
CREATININE: 0.99 mg/dL — AB (ref 0.60–0.93)
Calcium: 10.7 mg/dL — ABNORMAL HIGH (ref 8.6–10.4)
Chloride: 102 mmol/L (ref 98–110)
GFR, EST AFRICAN AMERICAN: 63 mL/min/{1.73_m2} (ref >=60)
GFR, EST NON AFRICAN AMERICAN: 55 mL/min/{1.73_m2} — AB (ref >=60)
GLOBULIN: 2.7 g/dL (ref 1.9–3.7)
Glucose, Bld: 93 mg/dL (ref 65–99)
Potassium: 4 mmol/L (ref 3.5–5.3)
Sodium: 139 mmol/L (ref 135–146)
TOTAL PROTEIN: 7.1 g/dL (ref 6.1–8.1)
Total Bilirubin: 0.9 mg/dL (ref 0.2–1.2)

## 2018-02-02 LAB — TSH: TSH: 2.61 mIU/L (ref 0.40–4.50)

## 2018-02-02 MED ORDER — MELOXICAM 7.5 MG PO TABS: 7.5000 mg | ORAL_TABLET | Freq: Every day | ORAL | 2 refills | Status: DC

## 2018-02-02 NOTE — Addendum Note (Signed)
Addended by: Maryla Morrow on: 02/02/2018 03:15 PM   Modules accepted: Orders

## 2018-02-02 NOTE — Addendum Note (Signed)
Addended by: Maryla Morrow on: 02/02/2018 12:19 PM   Modules accepted: Orders

## 2018-02-04 ENCOUNTER — Ambulatory Visit (INDEPENDENT_AMBULATORY_CARE_PROVIDER_SITE_OTHER): Payer: Medicare Other | Admitting: Osteopathic Medicine

## 2018-02-04 VITALS — BP 120/64 | HR 79

## 2018-02-04 DIAGNOSIS — M810 Age-related osteoporosis without current pathological fracture: Secondary | ICD-10-CM

## 2018-02-04 MED ORDER — DENOSUMAB 60 MG/ML ~~LOC~~ SOSY
60.0000 mg | PREFILLED_SYRINGE | Freq: Once | SUBCUTANEOUS | Status: AC
  Administered 2018-02-04: 60 mg via SUBCUTANEOUS

## 2018-02-04 NOTE — Progress Notes (Signed)
Pt came into clinic today for Prolia injection. Pt tolerated injection in left arm well, no immediate complications. Pt advised to schedule her next Prolia injection in 6 months, but to call first so we can order her lab work. Verbalized understanding. Did request a printed copy of her labs that were done this week, copy provided. She will recheck labs as instructed.

## 2018-03-04 LAB — PARATHYROID HORMONE, INTACT (NO CA): PTH: 311 pg/mL — ABNORMAL HIGH (ref 14–64)

## 2018-03-04 LAB — CALCIUM, IONIZED: Calcium, Ion: 5.17 mg/dL (ref 4.8–5.6)

## 2018-03-05 ENCOUNTER — Other Ambulatory Visit: Payer: Self-pay | Admitting: Osteopathic Medicine

## 2018-03-05 DIAGNOSIS — E213 Hyperparathyroidism, unspecified: Secondary | ICD-10-CM

## 2018-03-09 DIAGNOSIS — Z85820 Personal history of malignant melanoma of skin: Secondary | ICD-10-CM | POA: Diagnosis not present

## 2018-03-09 DIAGNOSIS — L720 Epidermal cyst: Secondary | ICD-10-CM | POA: Diagnosis not present

## 2018-03-09 DIAGNOSIS — Z08 Encounter for follow-up examination after completed treatment for malignant neoplasm: Secondary | ICD-10-CM | POA: Diagnosis not present

## 2018-03-09 DIAGNOSIS — L820 Inflamed seborrheic keratosis: Secondary | ICD-10-CM | POA: Diagnosis not present

## 2018-03-09 DIAGNOSIS — L821 Other seborrheic keratosis: Secondary | ICD-10-CM | POA: Diagnosis not present

## 2018-04-20 DIAGNOSIS — E785 Hyperlipidemia, unspecified: Secondary | ICD-10-CM | POA: Diagnosis not present

## 2018-04-20 DIAGNOSIS — I10 Essential (primary) hypertension: Secondary | ICD-10-CM | POA: Diagnosis not present

## 2018-04-20 DIAGNOSIS — E210 Primary hyperparathyroidism: Secondary | ICD-10-CM | POA: Diagnosis not present

## 2018-04-20 DIAGNOSIS — E039 Hypothyroidism, unspecified: Secondary | ICD-10-CM | POA: Diagnosis not present

## 2018-04-20 DIAGNOSIS — M810 Age-related osteoporosis without current pathological fracture: Secondary | ICD-10-CM | POA: Diagnosis not present

## 2018-05-03 ENCOUNTER — Encounter: Payer: Self-pay | Admitting: Osteopathic Medicine

## 2018-05-03 ENCOUNTER — Ambulatory Visit (INDEPENDENT_AMBULATORY_CARE_PROVIDER_SITE_OTHER): Payer: Medicare Other | Admitting: Osteopathic Medicine

## 2018-05-03 VITALS — BP 145/84 | HR 77 | Temp 98.4°F | Wt 177.8 lb

## 2018-05-03 DIAGNOSIS — L03011 Cellulitis of right finger: Secondary | ICD-10-CM | POA: Diagnosis not present

## 2018-05-03 DIAGNOSIS — R609 Edema, unspecified: Secondary | ICD-10-CM | POA: Diagnosis not present

## 2018-05-03 MED ORDER — CEPHALEXIN 500 MG PO CAPS: 500.0000 mg | ORAL_CAPSULE | Freq: Two times a day (BID) | ORAL | 0 refills | Status: DC

## 2018-05-03 NOTE — Patient Instructions (Addendum)
I think the swelling is more likely dependent edema - swelling due to fluid and gravity and age, worse likely due to recent stopping the fluid pill HCTZ and with recent standing and less walking. I'm not too worried about it.   I think there is a mild skin infection near that nail,it looks like it's ingrowing a bit. See below for instructions on home care for this. I sent some antibiotics for this.     Ingrown Nail An ingrown nail occurs when the corner or sides of a nail grow into the surrounding skin. This causes discomfort and pain. The big toe is most commonly affected, but any of the toes can be affected. If an ingrown nail is not treated, it can become infected. What are the causes? This condition may be caused by:  Wearing shoes that are too small or tight.  An injury, such as stubbing your toe or having your toe stepped on.  Improper cutting or care of your toenails.  Having nail or foot abnormalities that were present from birth (congenital abnormalities), such as having a nail that is too big for your toe. What increases the risk? The following factors may make you more likely to develop ingrown toenails:  Age. Nails tend to get thicker with age, so ingrown nails are more common among older people.  Cutting your nails incorrectly, such as cutting them very short or cutting them unevenly. An ingrown nail is more likely to get infected if you have:  Diabetes.  Blood flow (circulation) problems. What are the signs or symptoms? Symptoms of an ingrown nail may include:  Pain, soreness, or tenderness.  Redness.  Swelling.  Hardening of the skin that surrounds the nail. Signs that an ingrown nail may be infected include:  Fluid or pus.  Symptoms that get worse instead of better. How is this diagnosed? An ingrown nail may be diagnosed based on your medical history, your symptoms, and a physical exam. If you have fluid or blood coming from your nail, a sample may be  collected to test for the specific type of bacteria that is causing the infection. How is this treated? Treatment depends on how severe your ingrown nail is. You may be able to care for your nail at home.  If you have an infection, you may be prescribed antibiotic medicines.  If you have fluid or pus draining from your nail, your health care provider may drain it.  If you have trouble walking, you may be given crutches to use.  If you have a severe or infected ingrown nail, you may need a procedure to remove part or all of the nail. Follow these instructions at home: Foot care   Do not pick at your toenail or try to remove it yourself.  Soak your foot in warm, soapy water. Do this for 20 minutes, 3 times a day, or as often as told by your health care provider. This helps to keep your toe clean and keep your skin soft.  Wear shoes that fit well and are not too tight. Your health care provider may recommend that you wear open-toed shoes while you heal.  Trim your toenails regularly and carefully. Cut your toenails straight across to prevent injury to the skin at the corners of the toenail. Do not cut your nails in a curved shape.  Keep your feet clean and dry to help prevent infection. Medicines  Take over-the-counter and prescription medicines only as told by your health care provider.  If you were prescribed an antibiotic, take it as told by your health care provider. Do not stop taking the antibiotic even if you start to feel better. Activity  Return to your normal activities as told by your health care provider. Ask your health care provider what activities are safe for you.  Avoid activities that cause pain. General instructions  If your health care provider told you to use crutches to help you move around, use them as instructed.  Keep all follow-up visits as told by your health care provider. This is important. Contact a health care provider if:  You have more redness,  swelling, pain, or other symptoms that do not improve with treatment.  You have fluid, blood, or pus coming from your toenail. Get help right away if:  You have a red streak on your skin that starts at your foot and spreads up your leg.  You have a fever. Summary  An ingrown toenail occurs when the corner or sides of a toenail grow into the surrounding skin. This causes discomfort and pain. The big toe is most commonly affected, but any of the toes can be affected.  If an ingrown toenail is not treated, it can become infected.  Fluid or pus draining from your toenail is a sign of infection. Your health care provider may need to drain it. You may be given antibiotics to treat the infection.  Trimming your toenails regularly and properly can help you prevent an ingrown toenail. This information is not intended to replace advice given to you by your health care provider. Make sure you discuss any questions you have with your health care provider. Document Released: 03/14/2000 Document Revised: 12/03/2016 Document Reviewed: 12/03/2016 Elsevier Interactive Patient Education  2019 [employer].

## 2018-05-03 NOTE — Progress Notes (Signed)
HPI: Jennifer Ray is a 78 y.o. female who  has a past medical history of Hiatal hernia, Hypercholesteremia, Hypertension, Hypothyroid, and Melanoma ([place]).  she presents to [facility] today, 05/03/18,  for chief complaint of:  L foot/toes pain, R finger pain   . R middle finger s/p mild trauma and feels sore ever since then, bit swollen at the nail. Not really painful, has been soaking it.  . L foot/toes swollen x1 day, was walking a lot then standing more so past couple days. Dr Hartford Poli (endocrine - following for hyperparathyroidism) took her off HCTZ, she is just on Losartan since thiazide can raise calcium levels, BP a bit up today.          At today's visit 05/03/18 ... PMH, PSH, FH reviewed and updated as needed.  Current medication list and allergy/intolerance hx reviewed and updated as needed. (See remainder of HPI, ROS, Phys Exam below)          ASSESSMENT/PLAN: The primary encounter diagnosis was Paronychia of finger of right hand. A diagnosis of Dependent edema was also pertinent to this visit.     Meds ordered this encounter  Medications  . cephALEXin (KEFLEX) 500 MG capsule    Sig: Take 1 capsule (500 mg total) by mouth 2 (two) times daily.    Dispense:  14 capsule    Refill:  0    Patient Instructions  I think the swelling is more likely dependent edema - swelling due to fluid and gravity and age, worse likely due to recent stopping the fluid pill HCTZ and with recent standing and less walking. I'm not too worried about it.   I think there is a mild skin infection near that nail,it looks like it's ingrowing a bit. See below for instructions on home care for this. I sent some antibiotics for this.     [instructions printed, see AVS]    Follow-up plan: Return if symptoms worsen or fail to  improve.                                                 ################################################# ################################################# ################################################# #################################################    Current Meds  Medication Sig  . aspirin 81 MG tablet Take 1 tablet (81 mg total) by mouth daily.  Marland Kitchen denosumab (PROLIA) 60 MG/ML SOSY injection Inject 60 mg into the skin every 6 (six) months. Administer in upper arm, thigh, or abdomen  . diclofenac sodium (VOLTAREN) 1 % GEL Apply 4 g topically 4 (four) times daily. Knee DJD.  Marland Kitchen levothyroxine (SYNTHROID, LEVOTHROID) 25 MCG tablet Take 1 tablet (25 mcg total) by mouth daily before breakfast.  . losartan-hydrochlorothiazide (HYZAAR) 100-12.5 MG tablet Take 1 tablet by mouth daily.  . meloxicam (MOBIC) 7.5 MG tablet Take 1-2 tablets (7.5-15 mg total) by mouth daily. Prn pain  . Multiple Vitamin (MULTI-VITAMIN DAILY PO) Take by mouth.  . rosuvastatin (CRESTOR) 10 MG tablet Take 1 tablet (10 mg total) by mouth daily.    No Known Allergies     Review of Systems:  Constitutional: No recent illness  HEENT: No  headache, no vision change  Cardiac: No  chest pain, No  pressure, No palpitations, +LE edema as above  Respiratory:  No  shortness of breath. No  Cough  Gastrointestinal: No  abdominal pain, no change on bowel  habits  Musculoskeletal: No new myalgia/arthralgia  Skin: No  Rash, +skin redness R middle finger   Neurologic: No  weakness, No  Dizziness   Exam:  BP (!) 145/84 (BP Location: Left Arm, Patient Position: Sitting, Cuff Size: Normal)   Pulse 77   Temp 98.4 F (36.9 C) (Oral)   Wt 177 lb 12.8 oz (80.6 kg)   BMI 28.70 kg/m   Constitutional: VS see above. General Appearance: alert, well-developed, well-nourished, NAD  Eyes: Normal lids and conjunctive, non-icteric sclera  Ears, Nose, Mouth, Throat: MMM,  Normal external inspection ears/nares/mouth/lips/gums.  Neck: No masses, trachea midline.   Respiratory: Normal respiratory effort. no wheeze, no rhonchi, no rales  Cardiovascular: S1/S2 normal, no murmur, no rub/gallop auscultated. RRR.   LE edema at L foot more laterally and distally  Musculoskeletal: Gait normal. Symmetric and independent movement of all extremities  Ingrown fingernail w paronychia at base of nail on R middle finger, no purulent drainage   Neurological: Normal balance/coordination. No tremor.  Skin: warm, dry, intact.   Psychiatric: Normal judgment/insight. Normal mood and affect. Oriented x3.       Visit summary with medication list and pertinent instructions was printed for patient to review, patient was advised to alert [place] if any updates are needed. All questions at time of visit were answered - patient instructed to contact office with any additional concerns. ER/RTC precautions were reviewed with the patient and understanding verbalized.      Please note: voice recognition software was used to produce this document, and typos may escape review. Please contact Dr. Sheppard Coil for any needed clarifications.    Follow up plan: Return if symptoms worsen or fail to improve.

## 2018-05-10 DIAGNOSIS — D351 Benign neoplasm of parathyroid gland: Secondary | ICD-10-CM | POA: Diagnosis not present

## 2018-05-11 ENCOUNTER — Telehealth: Payer: Self-pay

## 2018-05-11 NOTE — Telephone Encounter (Signed)
[name] called to see if she can take hibiscus tea with her current medications. Please advise.

## 2018-05-11 NOTE — Telephone Encounter (Signed)
Potential for low blood pressure w/ hibiscus, probably not serious.   Her BP readings have been ok, if she gets dizzy/lightheaded she should not use the tea I always recommend patient double checking with their pharmacist as they may have more information on potential interactions with medicines.   BP Readings from Last 3 Encounters:  05/03/18 (!) 145/84  02/04/18 120/64  02/01/18 138/77

## 2018-05-11 NOTE — Telephone Encounter (Signed)
Dr. Loni Muse is PCP.

## 2018-05-12 NOTE — Telephone Encounter (Signed)
Left message advising of recommendations.  

## 2018-05-17 ENCOUNTER — Encounter: Payer: Self-pay | Admitting: Osteopathic Medicine

## 2018-05-17 ENCOUNTER — Ambulatory Visit (INDEPENDENT_AMBULATORY_CARE_PROVIDER_SITE_OTHER): Payer: Medicare Other | Admitting: Osteopathic Medicine

## 2018-05-17 VITALS — BP 142/72 | HR 78 | Temp 98.5°F | Wt 174.4 lb

## 2018-05-17 DIAGNOSIS — L03011 Cellulitis of right finger: Secondary | ICD-10-CM

## 2018-05-17 DIAGNOSIS — R609 Edema, unspecified: Secondary | ICD-10-CM | POA: Diagnosis not present

## 2018-05-17 MED ORDER — CHLORHEXIDINE GLUCONATE 2 % EX SOLN: CUTANEOUS | 1 refills | Status: DC

## 2018-05-17 MED ORDER — MUPIROCIN 2 % EX OINT: 1.0000 "application " | TOPICAL_OINTMENT | Freq: Three times a day (TID) | CUTANEOUS | 3 refills | Status: DC

## 2018-05-17 MED ORDER — HYDROCHLOROTHIAZIDE 12.5 MG PO TABS: 12.5000 mg | ORAL_TABLET | Freq: Every day | ORAL | 0 refills | Status: DC

## 2018-05-17 MED ORDER — DOXYCYCLINE HYCLATE 100 MG PO TABS: 100.0000 mg | ORAL_TABLET | Freq: Two times a day (BID) | ORAL | 0 refills | Status: DC

## 2018-05-17 MED ORDER — CICLOPIROX 8 % EX SOLN: Freq: Every day | CUTANEOUS | 0 refills | Status: DC

## 2018-05-17 NOTE — Progress Notes (Signed)
HPI: Jennifer Ray is a 78 y.o. female who  has a past medical history of Hiatal hernia, Hypercholesteremia, Hypertension, Hypothyroid, and Melanoma ([place]).  she presents to [facility] today, 05/17/18,  for chief complaint of:  R Finger pain and LLE swelling  Seen 2 weeks ago 05/03/2018 for same o mild paronychia R middle finger, nail maybe ingrowing slightly. Opted to tx abx Keflex and watchful waiting, continue soaks. Still has mild redness/swelling on cuticle.  o Also had mild edema, had recently stopped HCTZ, concern for puffiness of L foot at that time, thought to be dependent edema. Still worried about swelling/edema on L foot.    At today's visit 05/17/18 ... PMH, PSH, FH reviewed and updated as needed.  Current medication list and allergy/intolerance hx reviewed and updated as needed. (See remainder of HPI, ROS, Phys Exam below)           ASSESSMENT/PLAN: The primary encounter diagnosis was Paronychia of finger of right hand. A diagnosis of Dependent edema was also pertinent to this visit.  More aggressive soaks and change abx Will remove nail if no improvement   Meds ordered this encounter  Medications  . Chlorhexidine Gluconate 2 % SOLN    Sig: For soaking infected finger. Immerse finger in solution for 10-15 mins, 4-5 times per day    Dispense:  250 mL    Refill:  1  . doxycycline (VIBRA-TABS) 100 MG tablet    Sig: Take 1 tablet (100 mg total) by mouth 2 (two) times daily for 5 days.    Dispense:  10 tablet    Refill:  0  . ciclopirox (PENLAC) 8 % solution    Sig: Apply topically at bedtime. Apply over nail and surrounding skin. Apply daily over previous coat. After seven (7) days, may remove with alcohol and continue cycle.    Dispense:  6.6 mL    Refill:  0  . mupirocin ointment (BACTROBAN) 2 %    Sig: Apply 1 application topically 3 (three) times daily. Apply to affected area for 7-10 days.    Dispense:  30 g     Refill:  3  . hydrochlorothiazide (HYDRODIURIL) 12.5 MG tablet    Sig: Take 1 tablet (12.5 mg total) by mouth daily.    Dispense:  30 tablet    Refill:  0    Patient Instructions  Will tal different antibiotic for the finger Please soak 4-5 times per day 10-15 mins in the Chlorhexidine solution  After soaking, apply the Mupirocin antibiotic to the skin At night, apply the Ciclopirox antifungal polish to the nail and surrounding skin Add Doxycycline oral antibiotic Recheck in 2 days    Restart HCTZ for swelling Please purchase and use compression stockings to help the swelling         Follow-up plan: Return for recheck finger (FYI to scheduler: Weds 02/19  in last slot of AM or PM in case procedure is needed) .                                                 ################################################# ################################################# ################################################# #################################################    Current Meds  Medication Sig  . aspirin 81 MG tablet Take 1 tablet (81 mg total) by mouth daily.  Marland Kitchen denosumab (PROLIA) 60 MG/ML SOSY injection Inject 60 mg into the skin  every 6 (six) months. Administer in upper arm, thigh, or abdomen  . diclofenac sodium (VOLTAREN) 1 % GEL Apply 4 g topically 4 (four) times daily. Knee DJD.  Marland Kitchen levothyroxine (SYNTHROID, LEVOTHROID) 25 MCG tablet Take 1 tablet (25 mcg total) by mouth daily before breakfast.  . losartan-hydrochlorothiazide (HYZAAR) 100-12.5 MG tablet Take 1 tablet by mouth daily.  . meloxicam (MOBIC) 7.5 MG tablet Take 1-2 tablets (7.5-15 mg total) by mouth daily. Prn pain  . Multiple Vitamin (MULTI-VITAMIN DAILY PO) Take by mouth.  . rosuvastatin (CRESTOR) 10 MG tablet Take 1 tablet (10 mg total) by mouth daily.    No Known Allergies     Review of Systems:  Constitutional: No recent illness  Cardiac: No  chest pain,  No  pressure, No palpitations  Respiratory:  No  shortness of breath. No  Cough  Gastrointestinal: No  abdominal pain  Musculoskeletal: No new myalgia/arthralgia  Skin: +Rash  Neurologic: No  weakness, No  Dizziness   Exam:  BP (!) 142/72 (BP Location: Left Arm, Patient Position: Sitting, Cuff Size: Normal)   Pulse 78   Temp 98.5 F (36.9 C) (Oral)   Wt 174 lb 6.4 oz (79.1 kg)   BMI 28.15 kg/m   Constitutional: VS see above. General Appearance: alert, well-developed, well-nourished, NAD  Eyes: Normal lids and conjunctive, non-icteric sclera  Ears, Nose, Mouth, Throat: MMM, Normal external inspection ears/nares/mouth/lips/gums.  Neck: No masses, trachea midline.   Respiratory: Normal respiratory effort. no wheeze, no rhonchi, no rales  Cardiovascular: S1/S2 normal, no murmur, no rub/gallop auscultated. RRR.   Musculoskeletal: Gait normal. Symmetric and independent movement of all extremities  Neurological: Normal balance/coordination. No tremor.  Skin: warm, dry, intact.   R middle finger nail (+)onychomycosis and ingrowing, mild erythema on lateral aspect w/o obvious fluctuant subq mass  LLE trace edema at foot/ankle, non-pitting.   Psychiatric: Normal judgment/insight. Normal mood and affect. Oriented x3.       Visit summary with medication list and pertinent instructions was printed for patient to review, patient was advised to alert [place] if any updates are needed. All questions at time of visit were answered - patient instructed to contact office with any additional concerns. ER/RTC precautions were reviewed with the patient and understanding verbalized.     Please note: voice recognition software was used to produce this document, and typos may escape review. Please contact Dr. Sheppard Coil for any needed clarifications.    Follow up plan: Return for recheck finger (FYI to scheduler: Weds 02/19  in last slot of AM or PM in case procedure is needed) .

## 2018-05-17 NOTE — Patient Instructions (Addendum)
Will tal different antibiotic for the finger Please soak 4-5 times per day 10-15 mins in the Chlorhexidine solution  After soaking, apply the Mupirocin antibiotic to the skin At night, apply the Ciclopirox antifungal polish to the nail and surrounding skin Add Doxycycline oral antibiotic Recheck in 2 days    Restart HCTZ for swelling Please purchase and use compression stockings to help the swelling

## 2018-05-19 ENCOUNTER — Encounter: Payer: Self-pay | Admitting: Osteopathic Medicine

## 2018-05-19 ENCOUNTER — Ambulatory Visit (INDEPENDENT_AMBULATORY_CARE_PROVIDER_SITE_OTHER): Payer: Medicare Other | Admitting: Osteopathic Medicine

## 2018-05-19 VITALS — BP 140/79 | HR 76 | Temp 98.0°F | Wt 175.3 lb

## 2018-05-19 DIAGNOSIS — L03011 Cellulitis of right finger: Secondary | ICD-10-CM

## 2018-05-19 MED ORDER — CICLOPIROX 8 % EX SOLN: Freq: Every day | CUTANEOUS | 11 refills | Status: DC

## 2018-05-19 NOTE — Progress Notes (Signed)
HPI: Jennifer Ray is a 78 y.o. female who  has a past medical history of Hiatal hernia, Hypercholesteremia, Hypertension, Hypothyroid, and Melanoma ([place]).  she presents to [facility] today, 05/20/18,  for chief complaint of:  Recheck finger   R middle finger paronychia is getting better from 2 days ago, still looks a bit red but pt reports feels a lot better wince adding Doxy and medicated soaks.     At today's visit 05/20/18 ... PMH, PSH, FH reviewed and updated as needed.  Current medication list and allergy/intolerance hx reviewed and updated as needed. (See remainder of HPI, ROS, Phys Exam below)           ASSESSMENT/PLAN: The encounter diagnosis was Paronychia of finger of right hand.  Doing better per patient, will recheck as needed.     Meds ordered this encounter  Medications  . ciclopirox (PENLAC) 8 % solution    Sig: Apply topically at bedtime. Apply over nail and surrounding skin. Apply daily over previous coat. After seven (7) days, may remove with alcohol and continue cycle. Please run w/ GoodRx coupon    Dispense:  6.6 mL    Refill:  11       Follow-up plan: Return for annual physical & routine labs 01/2019, soonre if needed.                                                 ################################################# ################################################# ################################################# #################################################    Current Meds  Medication Sig  . aspirin 81 MG tablet Take 1 tablet (81 mg total) by mouth daily.  . cephALEXin (KEFLEX) 500 MG capsule Take 1 capsule (500 mg total) by mouth 2 (two) times daily.  . Chlorhexidine Gluconate 2 % SOLN For soaking infected finger. Immerse finger in solution for 10-15 mins, 4-5 times per day  . ciclopirox (PENLAC) 8 % solution Apply topically at bedtime. Apply over  nail and surrounding skin. Apply daily over previous coat. After seven (7) days, may remove with alcohol and continue cycle. Please run w/ GoodRx coupon  . denosumab (PROLIA) 60 MG/ML SOSY injection Inject 60 mg into the skin every 6 (six) months. Administer in upper arm, thigh, or abdomen  . diclofenac sodium (VOLTAREN) 1 % GEL Apply 4 g topically 4 (four) times daily. Knee DJD.  Marland Kitchen doxycycline (VIBRA-TABS) 100 MG tablet Take 1 tablet (100 mg total) by mouth 2 (two) times daily for 5 days.  . hydrochlorothiazide (HYDRODIURIL) 12.5 MG tablet Take 1 tablet (12.5 mg total) by mouth daily.  Marland Kitchen levothyroxine (SYNTHROID, LEVOTHROID) 25 MCG tablet Take 1 tablet (25 mcg total) by mouth daily before breakfast.  . losartan-hydrochlorothiazide (HYZAAR) 100-12.5 MG tablet Take 1 tablet by mouth daily.  . meloxicam (MOBIC) 7.5 MG tablet Take 1-2 tablets (7.5-15 mg total) by mouth daily. Prn pain  . Multiple Vitamin (MULTI-VITAMIN DAILY PO) Take by mouth.  . mupirocin ointment (BACTROBAN) 2 % Apply 1 application topically 3 (three) times daily. Apply to affected area for 7-10 days.  . rosuvastatin (CRESTOR) 10 MG tablet Take 1 tablet (10 mg total) by mouth daily.  . [DISCONTINUED] ciclopirox (PENLAC) 8 % solution Apply topically at bedtime. Apply over nail and surrounding skin. Apply daily over previous coat. After seven (7) days, may remove with alcohol and continue cycle.    No Known  Allergies     Review of Systems:  Constitutional: No fever  Musculoskeletal: No new myalgia/arthralgia  Skin: +Rash per HPI   Exam:  BP 140/79 (BP Location: Left Arm, Patient Position: Sitting, Cuff Size: Normal)   Pulse 76   Temp 98 F (36.7 C) (Oral)   Wt 175 lb 4.8 oz (79.5 kg)   BMI 28.29 kg/m   Constitutional: VS see above. General Appearance: alert, well-developed, well-nourished, NAD   Respiratory: Normal respiratory effort.   Musculoskeletal: R middle finger medial aspect of nail (+)erythema,  nondender, no ulceration or purulence   Neurological: Normal balance/coordination. No tremor.  Skin: warm, dry, intact.   Psychiatric: Normal judgment/insight. Normal mood and affect. Oriented x3.       Visit summary with medication list and pertinent instructions was printed for patient to review, patient was advised to alert [place] if any updates are needed. All questions at time of visit were answered - patient instructed to contact office with any additional concerns. ER/RTC precautions were reviewed with the patient and understanding verbalized.   Note: Total time spent 10 minutes, greater than 50% of the visit was spent face-to-face counseling and coordinating care for the following: The encounter diagnosis was Paronychia of finger of right hand..  Please note: voice recognition software was used to produce this document, and typos may escape review. Please contact Dr. Sheppard Coil for any needed clarifications.    Follow up plan: Return for annual physical & routine labs 01/2019, soonre if needed.

## 2018-05-25 ENCOUNTER — Telehealth: Payer: Self-pay

## 2018-05-25 NOTE — Telephone Encounter (Signed)
[name] called and states the finger is still infected. She needs the refill on Doxycycline as advised by Dr Sheppard Coil. Please advise.

## 2018-05-26 MED ORDER — DOXYCYCLINE HYCLATE 100 MG PO TABS: 100.0000 mg | ORAL_TABLET | Freq: Two times a day (BID) | ORAL | 0 refills | Status: DC

## 2018-05-26 NOTE — Telephone Encounter (Signed)
Typically if one round of the doxy doesn't help, it's not going to. I will refill this ONCE but if the finger isn't improved after that or if it gets worse she will NEED TO SEE ME for OV40 to remove that fingernail.   Please remind her she also needs to be using the Ciclopirox antifungal and continuing to soak the finger at least 3 times per day.

## 2018-05-26 NOTE — Telephone Encounter (Signed)
Patient advised.

## 2018-06-29 ENCOUNTER — Other Ambulatory Visit: Payer: Self-pay

## 2018-06-29 MED ORDER — HYDROCHLOROTHIAZIDE 12.5 MG PO TABS: 12.5000 mg | ORAL_TABLET | Freq: Every day | ORAL | 1 refills | Status: DC

## 2018-07-20 DIAGNOSIS — E210 Primary hyperparathyroidism: Secondary | ICD-10-CM | POA: Diagnosis not present

## 2018-07-20 DIAGNOSIS — I10 Essential (primary) hypertension: Secondary | ICD-10-CM | POA: Diagnosis not present

## 2018-07-30 ENCOUNTER — Telehealth: Payer: Self-pay

## 2018-07-30 NOTE — Telephone Encounter (Signed)
Patient advised to call back a week before her appointment to have Dr Sheppard Coil order labs for her appointment.

## 2018-08-02 ENCOUNTER — Ambulatory Visit: Payer: Medicare Other | Admitting: Osteopathic Medicine

## 2018-08-09 ENCOUNTER — Ambulatory Visit: Payer: Medicare Other | Admitting: Osteopathic Medicine

## 2018-08-18 ENCOUNTER — Other Ambulatory Visit: Payer: Self-pay | Admitting: Osteopathic Medicine

## 2018-08-18 MED ORDER — ROSUVASTATIN CALCIUM 10 MG PO TABS: 10.0000 mg | ORAL_TABLET | Freq: Every day | ORAL | 2 refills | Status: DC

## 2018-08-26 ENCOUNTER — Telehealth: Payer: Self-pay

## 2018-08-26 NOTE — Telephone Encounter (Signed)
Patient called and stated that her endocrinologist suggested that she should be on a fluid pill with less calcium. Patient wanting to change if Dr Sheppard Coil knows of a better ption for her. Please advise

## 2018-08-27 MED ORDER — FUROSEMIDE 20 MG PO TABS: 10.0000 mg | ORAL_TABLET | Freq: Every day | ORAL | 0 refills | Status: DC

## 2018-08-27 NOTE — Telephone Encounter (Signed)
Pt advised of changes. Stopping HCTZ and getting new medication today

## 2018-08-27 NOTE — Telephone Encounter (Signed)
Reviewed endocrinologist most recent note, she should stop the hydrochlorothiazide and start the furosemide that I sent into the pharmacy on file, [facility]

## 2018-08-31 ENCOUNTER — Telehealth: Payer: Self-pay | Admitting: Osteopathic Medicine

## 2018-08-31 DIAGNOSIS — E039 Hypothyroidism, unspecified: Secondary | ICD-10-CM

## 2018-08-31 DIAGNOSIS — Z8639 Personal history of other endocrine, nutritional and metabolic disease: Secondary | ICD-10-CM

## 2018-08-31 DIAGNOSIS — I10 Essential (primary) hypertension: Secondary | ICD-10-CM

## 2018-08-31 NOTE — Telephone Encounter (Signed)
orders are n

## 2018-08-31 NOTE — Telephone Encounter (Signed)
Pt advised.

## 2018-08-31 NOTE — Telephone Encounter (Signed)
Patient would like labs ordered for her normal 6 month check up. She will come do it tomorrow morning before appointment.

## 2018-09-01 ENCOUNTER — Encounter: Payer: Self-pay | Admitting: Osteopathic Medicine

## 2018-09-01 ENCOUNTER — Ambulatory Visit (INDEPENDENT_AMBULATORY_CARE_PROVIDER_SITE_OTHER): Payer: Medicare Other | Admitting: Osteopathic Medicine

## 2018-09-01 VITALS — BP 123/80 | HR 88 | Temp 98.7°F | Wt 166.0 lb

## 2018-09-01 DIAGNOSIS — E039 Hypothyroidism, unspecified: Secondary | ICD-10-CM | POA: Diagnosis not present

## 2018-09-01 DIAGNOSIS — L03011 Cellulitis of right finger: Secondary | ICD-10-CM

## 2018-09-01 DIAGNOSIS — Z8639 Personal history of other endocrine, nutritional and metabolic disease: Secondary | ICD-10-CM | POA: Diagnosis not present

## 2018-09-01 DIAGNOSIS — I10 Essential (primary) hypertension: Secondary | ICD-10-CM | POA: Diagnosis not present

## 2018-09-01 MED ORDER — CICLOPIROX 8 % EX SOLN: Freq: Every day | CUTANEOUS | 11 refills | Status: DC

## 2018-09-01 MED ORDER — CHLORHEXIDINE GLUCONATE 2 % EX SOLN: CUTANEOUS | 1 refills | Status: DC

## 2018-09-01 MED ORDER — DOXYCYCLINE HYCLATE 100 MG PO TABS: 100.0000 mg | ORAL_TABLET | Freq: Two times a day (BID) | ORAL | 0 refills | Status: AC

## 2018-09-01 MED ORDER — MUPIROCIN 2 % EX OINT: 1.0000 "application " | TOPICAL_OINTMENT | Freq: Four times a day (QID) | CUTANEOUS | 3 refills | Status: AC

## 2018-09-01 NOTE — Progress Notes (Signed)
HPI: Jennifer Ray is a 78 y.o. female who  has a past medical history of Hiatal hernia, Hypercholesteremia, Hypertension, Hypothyroid, and Melanoma ([place]).  she presents to [facility] today, 09/01/18,  for chief complaint of:  Finger problem   . Context: treated for paronychia of R middle finger back in 05/2018 (4 mos ago), initially seen 05/03/18 and we started Keflex for mild paronychia and slight ingrowing of the nail. Seen again 05/17/18 w/o significant improvement, we tried doxycycline and chlorhexidine soaks/washes w/ application of mupirocin after soaking and ciclopirox soln to nail and surrounding skin qhs. Rechecked 05/19/18 and was improved, opted to continue treatment and watchful waiting. Have heard nothing back until now 4 mos later.  . Location: R middle finger . Quality: red swelling in skin around nail, nail is thicker and curling in . Duration: this episode a few days   . Timing: constant . Assoc signs/symptoms: no fever, no new joint pain (chronic arthritis)     At today's visit 09/01/18 ... PMH, PSH, FH reviewed and updated as needed.  Current medication list and allergy/intolerance hx reviewed and updated as needed. (See remainder of HPI, ROS, Phys Exam below)   No results found.  No results found for this or any previous visit (from the past 72 hour(s)).        ASSESSMENT/PLAN: The encounter diagnosis was Paronychia of finger of right hand.     Meds ordered this encounter  Medications  . mupirocin ointment (BACTROBAN) 2 %    Sig: Apply 1 application topically 4 (four) times daily. Apply to dry skin after chlorhexidine washes to affected area for 7-10 days.    Dispense:  30 g    Refill:  3  . Chlorhexidine Gluconate 2 % SOLN    Sig: For soaking infected finger. Immerse finger in solution for 10-15 mins, 4-5 times per day    Dispense:  250 mL    Refill:  1  . ciclopirox (PENLAC) 8 % solution    Sig: Apply  topically at bedtime. Apply over nail and surrounding skin. Apply daily over previous coat. After seven (7) days, may remove with alcohol and continue cycle. Please run w/ GoodRx coupon    Dispense:  12 mL    Refill:  11  . doxycycline (VIBRA-TABS) 100 MG tablet    Sig: Take 1 tablet (100 mg total) by mouth 2 (two) times daily for 7 days.    Dispense:  14 tablet    Refill:  0    Patient Instructions  Doxycycline antibiotic for the finger Please soak 4-5 times per day 10-15 mins in the Chlorhexidine solution  After soaking, apply the Mupirocin antibiotic to the skin At night, apply the Ciclopirox antifungal polish to the nail and surrounding skin - continue doing this as directed for at least 3 months / until new nail is fully grown in.  If this isn't getting better, please call the office and we will set up a procedure time to remove the nail altogether.   Will let you know lab results as soon as I have these. Will arrange follow-up Prolia injection based on labs. I'll copy Dr Hartford Poli to the blood work results, too.        Follow-up plan: Return if symptoms worsen or fail to improve / based on lab results .                                                 ################################################# ################################################# ################################################# #################################################  Current Meds  Medication Sig  . Chlorhexidine Gluconate 2 % SOLN For soaking infected finger. Immerse finger in solution for 10-15 mins, 4-5 times per day  . ciclopirox (PENLAC) 8 % solution Apply topically at bedtime. Apply over nail and surrounding skin. Apply daily over previous coat. After seven (7) days, may remove with alcohol and continue cycle. Please run w/ GoodRx coupon  . denosumab (PROLIA) 60 MG/ML SOSY injection Inject 60 mg into the skin every 6 (six) months. Administer in  upper arm, thigh, or abdomen  . diclofenac sodium (VOLTAREN) 1 % GEL Apply 4 g topically 4 (four) times daily. Knee DJD.  . furosemide (LASIX) 20 MG tablet Take 0.5 tablets (10 mg total) by mouth daily.  Marland Kitchen levothyroxine (SYNTHROID, LEVOTHROID) 25 MCG tablet Take 1 tablet (25 mcg total) by mouth daily before breakfast.  . losartan (COZAAR) 100 MG tablet Take 100 mg by mouth daily.  . meloxicam (MOBIC) 7.5 MG tablet Take 1-2 tablets (7.5-15 mg total) by mouth daily. Prn pain  . Multiple Vitamin (MULTI-VITAMIN DAILY PO) Take by mouth.  . mupirocin ointment (BACTROBAN) 2 % Apply 1 application topically 4 (four) times daily. Apply to dry skin after chlorhexidine washes to affected area for 7-10 days.  . rosuvastatin (CRESTOR) 10 MG tablet Take 1 tablet (10 mg total) by mouth daily.  . [DISCONTINUED] Chlorhexidine Gluconate 2 % SOLN For soaking infected finger. Immerse finger in solution for 10-15 mins, 4-5 times per day  . [DISCONTINUED] ciclopirox (PENLAC) 8 % solution Apply topically at bedtime. Apply over nail and surrounding skin. Apply daily over previous coat. After seven (7) days, may remove with alcohol and continue cycle. Please run w/ GoodRx coupon  . [DISCONTINUED] mupirocin ointment (BACTROBAN) 2 % Apply 1 application topically 3 (three) times daily. Apply to affected area for 7-10 days.    No Known Allergies     Review of Systems:  Constitutional: No recent illness  HEENT: No  headache, no vision change  Cardiac: No  chest pain, No  pressure, No palpitations  Respiratory:  No  shortness of breath. No  Cough  Musculoskeletal: No new myalgia/arthralgia  Skin: +Rash  Neurologic: No  weakness, No  Dizziness   Exam:  BP 123/80   Pulse 88   Temp 98.7 F (37.1 C) (Oral)   Wt 166 lb (75.3 kg)   SpO2 97%   BMI 26.79 kg/m   Constitutional: VS see above. General Appearance: alert, well-developed, well-nourished, NAD  Eyes: Normal lids and conjunctive, non-icteric  sclera  Ears, Nose, Mouth, Throat: MMM, Normal external inspection ears/nares/mouth/lips/gums.  Neck: No masses, trachea midline.   Respiratory: Normal respiratory effort. no wheeze, no rhonchi, no rales  Cardiovascular: S1/S2 normal, no murmur, no rub/gallop auscultated. RRR.   Musculoskeletal: Gait normal. Symmetric and independent movement of all extremities. Normal ROM finger joints  Neurological: Normal balance/coordination. No tremor.  Skin: warm, dry, intact. Nail R middle finger is thickened and curling inward. Skin around the nail is erythematous, no ulceration or other breakdown.   Psychiatric: Normal judgment/insight. Normal mood and affect. Oriented x3.       Visit summary with medication list and pertinent instructions was printed for patient to review, patient was advised to alert [place] if any updates are needed. All questions at time of visit were answered - patient instructed to contact office with any additional concerns. ER/RTC precautions were reviewed with the patient and understanding verbalized.     Please note: voice recognition software was used to  produce this document, and typos may escape review. Please contact Dr. Sheppard Coil for any needed clarifications.    Follow up plan: Return if symptoms worsen or fail to improve / based on lab results .

## 2018-09-01 NOTE — Patient Instructions (Addendum)
Doxycycline antibiotic for the finger Please soak 4-5 times per day 10-15 mins in the Chlorhexidine solution  After soaking, apply the Mupirocin antibiotic to the skin At night, apply the Ciclopirox antifungal polish to the nail and surrounding skin - continue doing this as directed for at least 3 months / until new nail is fully grown in.  If this isn't getting better, please call the office and we will set up a procedure time to remove the nail altogether.   Will let you know lab results as soon as I have these. Will arrange follow-up Prolia injection based on labs. I'll copy Dr Hartford Poli to the blood work results, too.

## 2018-09-02 ENCOUNTER — Encounter: Payer: Self-pay | Admitting: Osteopathic Medicine

## 2018-09-02 LAB — COMPLETE METABOLIC PANEL WITH GFR
AG Ratio: 1.8 (calc) (ref 1.0–2.5)
ALT: 12 U/L (ref 6–29)
AST: 18 U/L (ref 10–35)
Albumin: 4.5 g/dL (ref 3.6–5.1)
Alkaline phosphatase (APISO): 54 U/L (ref 37–153)
BUN: 18 mg/dL (ref 7–25)
CO2: 27 mmol/L (ref 20–32)
Calcium: 10.5 mg/dL — ABNORMAL HIGH (ref 8.6–10.4)
Chloride: 106 mmol/L (ref 98–110)
Creat: 0.9 mg/dL (ref 0.60–0.93)
GFR, Est African American: 71 mL/min/{1.73_m2} (ref >=60)
GFR, Est Non African American: 61 mL/min/{1.73_m2} (ref >=60)
Globulin: 2.5 g/dL (calc) (ref 1.9–3.7)
Glucose, Bld: 91 mg/dL (ref 65–99)
Potassium: 3.9 mmol/L (ref 3.5–5.3)
Sodium: 141 mmol/L (ref 135–146)
Total Bilirubin: 0.9 mg/dL (ref 0.2–1.2)
Total Protein: 7 g/dL (ref 6.1–8.1)

## 2018-09-02 LAB — CBC
HCT: 41.4 % (ref 35.0–45.0)
Hemoglobin: 13.9 g/dL (ref 11.7–15.5)
MCH: 31.2 pg (ref 27.0–33.0)
MCHC: 33.6 g/dL (ref 32.0–36.0)
MCV: 93 fL (ref 80.0–100.0)
MPV: 11.7 fL (ref 7.5–12.5)
Platelets: 232 10*3/uL (ref 140–400)
RBC: 4.45 10*6/uL (ref 3.80–5.10)
RDW: 13.2 % (ref 11.0–15.0)
WBC: 5.1 10*3/uL (ref 3.8–10.8)

## 2018-09-02 LAB — LIPID PANEL
Cholesterol: 123 mg/dL (ref <200)
HDL: 55 mg/dL (ref >=50)
LDL Cholesterol (Calc): 49 mg/dL (calc)
Non-HDL Cholesterol (Calc): 68 mg/dL (calc) (ref <130)
Total CHOL/HDL Ratio: 2.2 (calc) (ref <5.0)
Triglycerides: 109 mg/dL (ref <150)

## 2018-09-02 LAB — TSH: TSH: 3.92 mIU/L (ref 0.40–4.50)

## 2018-09-08 ENCOUNTER — Ambulatory Visit (INDEPENDENT_AMBULATORY_CARE_PROVIDER_SITE_OTHER): Payer: Medicare Other | Admitting: Osteopathic Medicine

## 2018-09-08 VITALS — BP 142/90 | HR 78

## 2018-09-08 DIAGNOSIS — M810 Age-related osteoporosis without current pathological fracture: Secondary | ICD-10-CM | POA: Diagnosis not present

## 2018-09-08 MED ORDER — DENOSUMAB 60 MG/ML ~~LOC~~ SOSY
60.0000 mg | PREFILLED_SYRINGE | Freq: Once | SUBCUTANEOUS | Status: AC
  Administered 2018-09-08: 60 mg via SUBCUTANEOUS

## 2018-09-08 NOTE — Progress Notes (Signed)
Patient came in the clinic today for a Prolia injection. Patient tolerated injection well in the right arm with no immediate complications. Patient advised to scheduled her next Prolia injection in 6 months but to call first so we can order the lab work. She voices understanding. She also requested a copy of the recent lab results and a copy was provided.

## 2018-09-30 ENCOUNTER — Other Ambulatory Visit: Payer: Self-pay | Admitting: Family Medicine

## 2018-09-30 NOTE — Telephone Encounter (Signed)
Forwarding medication refill to PCP for review. 

## 2018-10-04 NOTE — Telephone Encounter (Signed)
Routing to person who last adjusted the Rx

## 2018-10-05 ENCOUNTER — Other Ambulatory Visit: Payer: Self-pay | Admitting: Osteopathic Medicine

## 2018-10-05 NOTE — Telephone Encounter (Signed)
Please advise 

## 2018-10-11 ENCOUNTER — Other Ambulatory Visit: Payer: Self-pay | Admitting: Family Medicine

## 2018-10-13 ENCOUNTER — Other Ambulatory Visit: Payer: Self-pay | Admitting: Osteopathic Medicine

## 2018-10-14 ENCOUNTER — Telehealth: Payer: Self-pay | Admitting: Osteopathic Medicine

## 2018-10-14 NOTE — Telephone Encounter (Signed)
Left a message advising her to call us back to let us know if she is taking the [place].

## 2018-10-14 NOTE — Telephone Encounter (Signed)
Ok not sure why it's on her list or why I keep getting requests for this. If it happens again will look into it further

## 2018-10-14 NOTE — Telephone Encounter (Signed)
Please call patient: I keep getting refill requests for Keppra but this wasn't on her medication list last time I saw her. I think there might be some kind of error. Can we confirm if she's taking this? Thanks.

## 2018-10-14 NOTE — Telephone Encounter (Signed)
Patient states she doesn't take Keppra.

## 2018-11-18 DIAGNOSIS — Z23 Encounter for immunization: Secondary | ICD-10-CM | POA: Diagnosis not present

## 2018-12-07 ENCOUNTER — Other Ambulatory Visit: Payer: Self-pay | Admitting: Osteopathic Medicine

## 2018-12-07 DIAGNOSIS — Z1231 Encounter for screening mammogram for malignant neoplasm of breast: Secondary | ICD-10-CM

## 2018-12-27 ENCOUNTER — Ambulatory Visit (INDEPENDENT_AMBULATORY_CARE_PROVIDER_SITE_OTHER): Payer: Medicare Other | Admitting: Family Medicine

## 2018-12-27 ENCOUNTER — Other Ambulatory Visit: Payer: Self-pay

## 2018-12-27 VITALS — BP 144/84 | HR 87 | Temp 98.4°F | Wt 174.0 lb

## 2018-12-27 DIAGNOSIS — B351 Tinea unguium: Secondary | ICD-10-CM

## 2018-12-27 DIAGNOSIS — L600 Ingrowing nail: Secondary | ICD-10-CM | POA: Diagnosis not present

## 2018-12-27 DIAGNOSIS — L03011 Cellulitis of right finger: Secondary | ICD-10-CM | POA: Diagnosis not present

## 2018-12-27 MED ORDER — DOXYCYCLINE HYCLATE 100 MG PO TABS: 100.0000 mg | ORAL_TABLET | Freq: Two times a day (BID) | ORAL | 0 refills | Status: DC

## 2018-12-27 MED ORDER — CHLORHEXIDINE GLUCONATE 2 % EX SOLN: CUTANEOUS | 1 refills | Status: AC

## 2018-12-27 MED ORDER — TERBINAFINE HCL 250 MG PO TABS: 250.0000 mg | ORAL_TABLET | Freq: Every day | ORAL | 1 refills | Status: AC

## 2018-12-27 NOTE — Progress Notes (Signed)
[name] P Scripture is a 78 y.o. female who presents to [facility] today for a painful right third digit. Patient reports that since at least February she has had a painful ingrown nail on her right third digit and. She has been treated with Keflex in February and Doxycycline in June. She uses a chlorhexadine wash and soaks her finger 3-4 times daily. She has tried a topical antifungal polish which did not help much.   The digit has since become painful, swollen, and tender to touch.  She thinks is been worse about a week or so now.  She has a history of several ingrown toenails, both of which were removed. She denies fever, chills, malaise, or other present abscesses.   ROS as above: Denies fever, chills, malaise, sore throat  Exam:  BP (!) 144/84   Pulse 87   Temp 98.4 F (36.9 C)   Wt 174 lb (78.9 kg)   BMI 28.08 kg/m  Wt Readings from Last 5 Encounters:  12/28/18 174 lb (78.9 kg)  09/01/18 166 lb (75.3 kg)  05/19/18 175 lb 4.8 oz (79.5 kg)  05/17/18 174 lb 6.4 oz (79.1 kg)  05/03/18 177 lb 12.8 oz (80.6 kg)    Gen: Well NAD HEENT: EOMI,  MMM Lungs: Normal work of breathing. CTABL Heart: RRR no MRG Abd: NABS, Soft. Nondistended, Nontender Exts: Brisk capillary refill, warm and well perfused.  Right hand: Right hand right 3rd digit is erythematous, edematous, warm and tender to palpation with minimal fluctuance on the distal aspect surrounding the fingernail. Fingernail is abnormally curved into the skin and yellow in color. Remaining fingernails are normal in appearance.   Lab and Radiology Results   Chemistry      Component Value Date/Time   NA 141 09/01/2018 0848   K 3.9 09/01/2018 0848   CL 106 09/01/2018 0848   CO2 27 09/01/2018 0848   BUN 18 09/01/2018 0848   CREATININE 0.90 09/01/2018 0848      Component Value Date/Time   CALCIUM 10.5 (H)  09/01/2018 0848   AST 18 09/01/2018 0848   ALT 12 09/01/2018 0848   BILITOT 0.9 09/01/2018 0848        Assessment and Plan: 78 y.o. female with right 3rd digit pain. Her fingernail appears to have a fungal infection causing it to become ingrown. She now has a superimposed painful abscess surrounding the fingernail. Will start doxycycline for 7 days to treat the bacterial infection. Will also start terbinafine for at least three months to help nail regrow normally.  Liver function test normal in June.  Plan to follow up in 3 months. Advised patient to return sooner if issue does not resolve.   PDMP not reviewed this encounter. No orders of the defined types were placed in this encounter.  Meds ordered this encounter  Medications  . doxycycline (VIBRA-TABS) 100 MG tablet    Sig: Take 1 tablet (100 mg total) by mouth 2 (two) times daily.    Dispense:  14 tablet    Refill:  0  . terbinafine (LAMISIL) 250 MG tablet    Sig: Take 1 tablet (250 mg total) by mouth daily. For nail fugus    Dispense:  90 tablet    Refill:  1  . Chlorhexidine Gluconate 2 % SOLN    Sig: For soaking infected finger. Immerse finger in solution for 10-15 mins, 4-5 times per day    Dispense:  250 mL    Refill:  1     Historical information moved to improve visibility of documentation.  Past Medical History:  Diagnosis Date  . Hiatal hernia   . Hypercholesteremia   . Hypertension   . Hypothyroid   . Melanoma ([place])    removed   Past Surgical History:  Procedure Laterality Date  . TONSILLECTOMY AND ADENOIDECTOMY    . TUBAL LIGATION     Social History   Tobacco Use  . Smoking status: Never Smoker  . Smokeless tobacco: Never Used  Substance Use Topics  . Alcohol use: No   family history includes Cancer in her mother and sister; Heart attack in her mother.  Medications: Current Outpatient Medications  Medication Sig Dispense Refill  . aspirin 81 MG tablet Take 1 tablet (81 mg total) by mouth  daily. (Patient not taking: Reported on 09/01/2018) 90 tablet 3  . Chlorhexidine Gluconate 2 % SOLN For soaking infected finger. Immerse finger in solution for 10-15 mins, 4-5 times per day 250 mL 1  . ciclopirox (PENLAC) 8 % solution Apply topically at bedtime. Apply over nail and surrounding skin. Apply daily over previous coat. After seven (7) days, may remove with alcohol and continue cycle. Please run w/ GoodRx coupon 12 mL 11  . denosumab (PROLIA) 60 MG/ML SOSY injection Inject 60 mg into the skin every 6 (six) months. Administer in upper arm, thigh, or abdomen 1 Syringe 1  . diclofenac sodium (VOLTAREN) 1 % GEL Apply 4 g topically 4 (four) times daily. Knee DJD. 100 g 11  . doxycycline (VIBRA-TABS) 100 MG tablet Take 1 tablet (100 mg total) by mouth 2 (two) times daily. 14 tablet 0  . furosemide (LASIX) 20 MG tablet Take 0.5 tablets (10 mg total) by mouth daily. 90 tablet 0  . levothyroxine (SYNTHROID) 25 MCG tablet TAKE 1 TABLET (25 MCG TOTAL) BY MOUTH DAILY BEFORE BREAKFAST. 90 tablet 3  . losartan (COZAAR) 100 MG tablet Take 100 mg by mouth daily.    . meloxicam (MOBIC) 7.5 MG tablet Take 1-2 tablets (7.5-15 mg total) by mouth daily. Prn pain 60 tablet 2  . Multiple Vitamin (MULTI-VITAMIN DAILY PO) Take by mouth.    . mupirocin ointment (BACTROBAN) 2 % Apply 1 application topically 4 (four) times daily. Apply to dry skin after chlorhexidine washes to affected area for 7-10 days. 30 g 3  . rosuvastatin (CRESTOR) 10 MG tablet Take 1 tablet (10 mg total) by mouth daily. 90 tablet 2  . terbinafine (LAMISIL) 250 MG tablet Take 1 tablet (250 mg total) by mouth daily. For nail fugus 90 tablet 1   No current facility-administered medications for this visit.    No Known Allergies   Discussed warning signs or symptoms. Please see discharge instructions. Patient expresses understanding.  I personally was present and performed or re-performed the history, physical exam and medical decision-making  activities of this service and have verified that the service and findings are accurately documented in the student's note. ___________________________________________ Lynne Leader M.D., ABFM., CAQSM. Primary Care and Sports Medicine Adjunct Instructor of [employer]

## 2018-12-27 NOTE — Patient Instructions (Addendum)
Thank you for coming in today. Take oral doxycycline for 1 week.  Take oral terbinafine for 3 months for nail fungus.  Follow up with Dr Eustace Pen in 3 months. Return sooner if needed.

## 2018-12-27 NOTE — Progress Notes (Signed)
Subjective:   Jennifer Ray is a 78 y.o. female who presents for Medicare Annual (Subsequent) preventive examination.  Review of Systems:  No ROS.  Medicare Wellness Virtual Visit.  Visual/audio telehealth visit, UTA vital signs.   See social history for additional risk factors.    Cardiac Risk Factors include: advanced age (>22men, >65 women);hypertension Sleep patterns: Getting 7 hours of sleep a night. Wakes up 1 time during the night to void. Wakes up feeling refreshed and ready for the day.    Home Safety/Smoke Alarms: Feels safe in home. Smoke alarms in place.  Living environment; Lives alone in a 1 story home and stairs have hand rails on them. Shower  Is a walk in shower and grab bars are in place as well as a bench. Seat Belt Safety/Bike Helmet: Wears seat belt.   Female:   Pap- Aged out      Mammo- Aged out      Dexa scan-  UTD      CCS- Aged out      Objective:     Vitals: BP 121/71   Pulse 84   Temp 98.2 F (36.8 C) (Oral)   Ht 5\' 4"  (1.626 m)   Wt 173 lb (78.5 kg)   SpO2 95%   BMI 29.70 kg/m   Body mass index is 29.7 kg/m.  Advanced Directives 12/29/2018 12/28/2017  Does Patient Have a Medical Advance Directive? Yes Yes  Type of Paramedic of [place];Living will [place];Living will  Does patient want to make changes to medical advance directive? No - Patient declined Yes (MAU/Ambulatory/Procedural Areas - Information given)  Copy of [place] in Chart? No - copy requested No - copy requested    Tobacco Social History   Tobacco Use  Smoking Status Never Smoker  Smokeless Tobacco Never Used     Counseling given: Not Answered   Clinical Intake:                       Past Medical History:  Diagnosis Date  . Hiatal hernia   . Hypercholesteremia   . Hypertension   . Hypothyroid   . Melanoma ([place])    removed   Past Surgical History:  Procedure Laterality Date   . TONSILLECTOMY AND ADENOIDECTOMY    . TUBAL LIGATION     Family History  Problem Relation Age of Onset  . Cancer Mother        Breast  . Heart attack Mother   . Cancer Sister        breast   Social History   Socioeconomic History  . Marital status: Divorced    Spouse name: Not on file  . Number of children: 2  . Years of education: 32  . Highest education level: 12th grade  Occupational History  . Occupation: retired    Comment: Catering manager  Social Needs  . Financial resource strain: Not hard at all  . Food insecurity    Worry: Never true    Inability: Never true  . Transportation needs    Medical: No    Non-medical: No  Tobacco Use  . Smoking status: Never Smoker  . Smokeless tobacco: Never Used  Substance and Sexual Activity  . Alcohol use: No  . Drug use: No  . Sexual activity: Not Currently  Lifestyle  . Physical activity    Days per week: 7 days    Minutes per session:  20 min  . Stress: Not at all  Relationships  . Social connections    Talks on phone: More than three times a week    Gets together: Once a week    Attends religious service: Never    Active member of club or organization: Yes    Attends meetings of clubs or organizations: More than 4 times per year    Relationship status: Divorced  Other Topics Concern  . Not on file  Social History Narrative   Patient walks everyday nad does stairs everyday. Eats very healthy a lot of vegetables.    Outpatient Encounter Medications as of 12/29/2018  Medication Sig  . Chlorhexidine Gluconate 2 % SOLN For soaking infected finger. Immerse finger in solution for 10-15 mins, 4-5 times per day  . ciclopirox (PENLAC) 8 % solution Apply topically at bedtime. Apply over nail and surrounding skin. Apply daily over previous coat. After seven (7) days, may remove with alcohol and continue cycle. Please run w/ GoodRx coupon  . denosumab (PROLIA) 60 MG/ML SOSY injection Inject 60 mg into the skin  every 6 (six) months. Administer in upper arm, thigh, or abdomen  . diclofenac sodium (VOLTAREN) 1 % GEL Apply 4 g topically 4 (four) times daily. Knee DJD.  Marland Kitchen doxycycline (VIBRA-TABS) 100 MG tablet Take 1 tablet (100 mg total) by mouth 2 (two) times daily.  . furosemide (LASIX) 20 MG tablet Take 0.5 tablets (10 mg total) by mouth daily.  Marland Kitchen levothyroxine (SYNTHROID) 25 MCG tablet TAKE 1 TABLET (25 MCG TOTAL) BY MOUTH DAILY BEFORE BREAKFAST.  Marland Kitchen losartan (COZAAR) 100 MG tablet Take 100 mg by mouth daily.  . Multiple Vitamin (MULTI-VITAMIN DAILY PO) Take by mouth.  . mupirocin ointment (BACTROBAN) 2 % Apply 1 application topically 4 (four) times daily. Apply to dry skin after chlorhexidine washes to affected area for 7-10 days.  . rosuvastatin (CRESTOR) 10 MG tablet Take 1 tablet (10 mg total) by mouth daily.  Marland Kitchen terbinafine (LAMISIL) 250 MG tablet Take 1 tablet (250 mg total) by mouth daily. For nail fugus  . aspirin 81 MG tablet Take 1 tablet (81 mg total) by mouth daily. (Patient not taking: Reported on 09/01/2018)  . meloxicam (MOBIC) 7.5 MG tablet Take 1-2 tablets (7.5-15 mg total) by mouth daily. Prn pain (Patient not taking: Reported on 12/29/2018)  . [DISCONTINUED] Chlorhexidine Gluconate 2 % SOLN For soaking infected finger. Immerse finger in solution for 10-15 mins, 4-5 times per day   No facility-administered encounter medications on file as of 12/29/2018.     Activities of Daily Living In your present state of health, do you have any difficulty performing the following activities: 12/29/2018  Hearing? Y  Comment wears earing aids she wears occasionally  Vision? N  Difficulty concentrating or making decisions? N  Walking or climbing stairs? N  Dressing or bathing? N  Doing errands, shopping? N  Preparing Food and eating ? N  Using the Toilet? N  In the past six months, have you accidently leaked urine? N  Do you have problems with loss of bowel control? N  Managing your Medications? N   Managing your Finances? N  Housekeeping or managing your Housekeeping? N  Some recent data might be hidden    Patient Care Team: Emeterio Reeve, DO as PCP - General (Osteopathic Medicine)    Assessment:   This is a routine wellness examination for [name].Physical assessment deferred to PCP.   Exercise Activities and Dietary recommendations Current Exercise Habits: Home  exercise routine, Type of exercise: walking;stretching;yoga, Time (Minutes): 20, Frequency (Times/Week): 7, Weekly Exercise (Minutes/Week): 140, Intensity: Mild Diet Eats a healthy diet of vegetables, fruits and proteins. Breakfast: Peanut butter toast ot protein chocolate shake Lunch:  [place] sandwich Dinner: Meat and vegetables, spinach, swiss chard, apple peach    Drinks 4 bottles of water a day  Goals    . DIET - INCREASE WATER INTAKE     Drink at least 8 8oz. Bottles of water daily    . Exercise 150 min/wk Moderate Activity     Would like to work out more than what she is currently doing now.       Fall Risk Fall Risk  12/29/2018 12/28/2017  Falls in the past year? 0 No  Number falls in past yr: 0 -  Injury with Fall? 0 -  Follow up Falls prevention discussed -   Is the patient's home free of loose throw rugs in walkways, pet beds, electrical cords, etc?   yes      Grab bars in the bathroom? yes      Handrails on the stairs?   yes      Adequate lighting?   yes   Depression Screen PHQ 2/9 Scores 12/29/2018 05/03/2018 12/28/2017 07/30/2017  PHQ - 2 Score 0 0 0 0  PHQ- 9 Score - - - 0     Cognitive Function     6CIT Screen 12/29/2018 12/28/2017  What Year? 0 points 0 points  What month? 0 points 0 points  What time? 0 points 0 points  Count back from 20 0 points 0 points  Months in reverse 0 points 0 points  Repeat phrase 0 points 0 points  Total Score 0 0    Immunization History  Administered Date(s) Administered  . Influenza-Unspecified 11/22/2017  . Tdap 07/30/2017    Screening  Tests Health Maintenance  Topic Date Due  . PNA vac Low Risk Adult (1 of 2 - PCV13) 09/28/2004  . INFLUENZA VACCINE  10/30/2018  . TETANUS/TDAP  07/31/2027  . DEXA SCAN  Completed        Plan:      Jennifer Ray , Thank you for taking time to come for your Medicare Wellness Visit. I appreciate your ongoing commitment to your health goals. Please review the following plan we discussed and let me know if I can assist you in the future. Please schedule your next medicare wellness visit with me in 1 yr. Continue doing brain stimulating activities (puzzles, reading, adult coloring books, staying active) to keep memory sharp.  Bring a copy of your living will and/or healthcare power of attorney to your next office visit.    These are the goals we discussed: Goals    . DIET - INCREASE WATER INTAKE     Drink at least 8 8oz. Bottles of water daily    . Exercise 150 min/wk Moderate Activity     Would like to work out more than what she is currently doing now.       This is a list of the screening recommended for you and due dates:  Health Maintenance  Topic Date Due  . Pneumonia vaccines (1 of 2 - PCV13) 09/28/2004  . Flu Shot  10/30/2018  . Tetanus Vaccine  07/31/2027  . DEXA scan (bone density measurement)  Completed      I have personally reviewed and noted the following in the patient's chart:   . Medical and social history . Use of  alcohol, tobacco or illicit drugs  . Current medications and supplements . Functional ability and status . Nutritional status . Physical activity . Advanced directives . List of other physicians . Hospitalizations, surgeries, and ER visits in previous 12 months . Vitals . Screenings to include cognitive, depression, and falls . Referrals and appointments  In addition, I have reviewed and discussed with patient certain preventive protocols, quality metrics, and best practice recommendations. A written personalized care plan for preventive  services as well as general preventive health recommendations were provided to patient.     Joanne Chars, LPN  [id number]

## 2018-12-28 DIAGNOSIS — L600 Ingrowing nail: Secondary | ICD-10-CM | POA: Insufficient documentation

## 2018-12-28 DIAGNOSIS — B351 Tinea unguium: Secondary | ICD-10-CM | POA: Insufficient documentation

## 2018-12-29 ENCOUNTER — Other Ambulatory Visit: Payer: Self-pay

## 2018-12-29 ENCOUNTER — Ambulatory Visit (INDEPENDENT_AMBULATORY_CARE_PROVIDER_SITE_OTHER): Payer: Medicare Other | Admitting: *Deleted

## 2018-12-29 VITALS — BP 121/71 | HR 84 | Temp 98.2°F | Ht 64.0 in | Wt 173.0 lb

## 2018-12-29 DIAGNOSIS — Z0000 Encounter for general adult medical examination without abnormal findings: Secondary | ICD-10-CM

## 2018-12-29 DIAGNOSIS — Z23 Encounter for immunization: Secondary | ICD-10-CM

## 2018-12-29 NOTE — Patient Instructions (Signed)
Ms. Towsley , Thank you for taking time to come for your Medicare Wellness Visit. I appreciate your ongoing commitment to your health goals. Please review the following plan we discussed and let me know if I can assist you in the future. Please schedule your next medicare wellness visit with me in 1 yr. Continue doing brain stimulating activities (puzzles, reading, adult coloring books, staying active) to keep memory sharp.  Bring a copy of your living will and/or healthcare power of attorney to your next office visit.   These are the goals we discussed: Goals    . DIET - INCREASE WATER INTAKE     Drink at least 8 8oz. Bottles of water daily    . Exercise 150 min/wk Moderate Activity     Would like to work out more than what she is currently doing now.

## 2019-01-19 ENCOUNTER — Ambulatory Visit (INDEPENDENT_AMBULATORY_CARE_PROVIDER_SITE_OTHER): Payer: Medicare Other

## 2019-01-19 ENCOUNTER — Other Ambulatory Visit: Payer: Self-pay

## 2019-01-19 DIAGNOSIS — Z1231 Encounter for screening mammogram for malignant neoplasm of breast: Secondary | ICD-10-CM

## 2019-01-27 ENCOUNTER — Ambulatory Visit: Payer: Medicare Other

## 2019-02-10 ENCOUNTER — Other Ambulatory Visit: Payer: Self-pay | Admitting: Osteopathic Medicine

## 2019-02-10 DIAGNOSIS — L608 Other nail disorders: Secondary | ICD-10-CM | POA: Diagnosis not present

## 2019-02-10 NOTE — Telephone Encounter (Signed)
Forwarding medication refill request to the clinical pool for review. 

## 2019-03-14 ENCOUNTER — Telehealth: Payer: Self-pay | Admitting: Osteopathic Medicine

## 2019-03-14 DIAGNOSIS — M810 Age-related osteoporosis without current pathological fracture: Secondary | ICD-10-CM

## 2019-03-14 NOTE — Telephone Encounter (Signed)
-----   Message from Tasia Catchings, [place] sent at 03/14/2019 11:09 AM EST ----- Patient called and wanted to get her next prolia shot. She is aware that I am working on this.  I have faxed the information to the [facility] and waiting on decision from [facility] for this patient. Patient is aware we will call for labs once we receive the approval.

## 2019-03-17 NOTE — Telephone Encounter (Signed)
I just received the approval for this patients prolia. Would you like a BMP or a CMP? Please advise.

## 2019-03-17 NOTE — Telephone Encounter (Signed)
BMP is all we need!

## 2019-03-17 NOTE — Telephone Encounter (Signed)
Labs have been ordered and patient is aware to get labs and we will set her up with a nurse visit once this is completed. No other questions.

## 2019-03-19 LAB — BASIC METABOLIC PANEL
BUN: 15 mg/dL (ref 7–25)
CO2: 28 mmol/L (ref 20–32)
Calcium: 10.7 mg/dL — ABNORMAL HIGH (ref 8.6–10.4)
Chloride: 106 mmol/L (ref 98–110)
Creat: 0.89 mg/dL (ref 0.60–0.93)
Glucose, Bld: 80 mg/dL (ref 65–99)
Potassium: 4 mmol/L (ref 3.5–5.3)
Sodium: 142 mmol/L (ref 135–146)

## 2019-03-22 NOTE — Telephone Encounter (Addendum)
OK to schedule prolia Calcium was a bit elevated, which won't be a problem for taking the prolia but since it's a bit higher from last time I'd like to get a few extra tests to see what the cause might be. I've added labs (probably [facility] wont be able to add these to specimen, hae patient get blood drawn prior to injection)

## 2019-03-22 NOTE — Addendum Note (Signed)
Addended by: Maryla Morrow on: 03/22/2019 05:26 PM   Modules accepted: Orders

## 2019-03-23 NOTE — Telephone Encounter (Signed)
I have called the patient and she has a follow up on 03/28/2019. She will get additional labs on that day as well after her Prolia shot. No other questions.

## 2019-03-28 ENCOUNTER — Other Ambulatory Visit: Payer: Self-pay

## 2019-03-28 ENCOUNTER — Ambulatory Visit (INDEPENDENT_AMBULATORY_CARE_PROVIDER_SITE_OTHER): Payer: Medicare Other | Admitting: Osteopathic Medicine

## 2019-03-28 VITALS — BP 138/77 | HR 86 | Temp 97.8°F | Wt 174.0 lb

## 2019-03-28 DIAGNOSIS — M810 Age-related osteoporosis without current pathological fracture: Secondary | ICD-10-CM | POA: Diagnosis not present

## 2019-03-28 MED ORDER — DENOSUMAB 60 MG/ML ~~LOC~~ SOSY
60.0000 mg | PREFILLED_SYRINGE | Freq: Once | SUBCUTANEOUS | Status: AC
  Administered 2019-03-28: 60 mg via SUBCUTANEOUS

## 2019-03-28 NOTE — Progress Notes (Signed)
Pt in office today for prolia injection. All labs have been completed.. Injection given in right arm, pt tolerated injection well. Pt advised next injection due in 6 months.

## 2019-03-29 LAB — PARATHYROID HORMONE, INTACT (NO CA): PTH: 151 pg/mL — ABNORMAL HIGH (ref 14–64)

## 2019-03-29 LAB — PHOSPHORUS: Phosphorus: 3.3 mg/dL (ref 2.1–4.3)

## 2019-03-29 LAB — TSH+FREE T4: TSH W/REFLEX TO FT4: 3.21 mIU/L (ref 0.40–4.50)

## 2019-03-29 LAB — CALCIUM, IONIZED: Calcium, Ion: 5.42 mg/dL (ref 4.8–5.6)

## 2019-04-07 ENCOUNTER — Telehealth: Payer: Self-pay

## 2019-04-07 NOTE — Telephone Encounter (Signed)
[name] called about COVID-19 vaccine. Advised if you are in Phase 1b, age 80 and over, you can contact the [facility] Department at [phone] to schedule an appointment for your vaccine.  Phone lines will be open 8 AM to 6:30 PM Monday through Friday, and 8 AM to 1 PM on Saturdays and Sundays.  They will start the vaccine clinic 04/06/2019 per the [facility] Department web site. If you do not live in [facility] I would recommend you go to your county health department web site to find out how to register for the vaccine and where to go.

## 2019-05-09 ENCOUNTER — Other Ambulatory Visit: Payer: Self-pay | Admitting: Osteopathic Medicine

## 2019-05-11 ENCOUNTER — Other Ambulatory Visit: Payer: Self-pay

## 2019-05-11 NOTE — Telephone Encounter (Signed)
[name] states she needs a refill on Losartan. She states the furosemide causes her to have cracked lips. She wanted to know if there is something she can do about this or can the medication be changed to something different. Please advise.

## 2019-05-12 ENCOUNTER — Telehealth: Payer: Self-pay

## 2019-05-12 ENCOUNTER — Telehealth: Payer: Self-pay | Admitting: Osteopathic Medicine

## 2019-05-12 MED ORDER — LOSARTAN POTASSIUM 100 MG PO TABS: 100.0000 mg | ORAL_TABLET | Freq: Every day | ORAL | 3 refills | Status: DC

## 2019-05-12 NOTE — Telephone Encounter (Signed)
sent 

## 2019-05-12 NOTE — Telephone Encounter (Signed)
Patient calls and is wanting a refill on her Losartan.  I looked and it doesn't look like you have ever filled this medication. Please advise

## 2019-05-12 NOTE — Telephone Encounter (Signed)
Please be sure she is taking Lasix as directed, half a tablet daily, this is a very low dose.  If maintaining good hydration and applying lip protectant such as Chapstick or Vaseline is not helpful, she can stop the medication but will need blood pressure check about a week after

## 2019-05-12 NOTE — Telephone Encounter (Signed)
[facility] pharmacy requesting med refill for losartan 100 mg. Rx not listed on active med list. Contacted pt, she has confirmed that she is requesting a med refill. Rx was recommended by her Endocrinologist.

## 2019-05-13 NOTE — Telephone Encounter (Signed)
Left patient msg advising of provider msg

## 2019-05-16 NOTE — Telephone Encounter (Signed)
Patient advised of recommendations.  

## 2019-06-10 DIAGNOSIS — E210 Primary hyperparathyroidism: Secondary | ICD-10-CM | POA: Diagnosis not present

## 2019-06-22 DIAGNOSIS — E785 Hyperlipidemia, unspecified: Secondary | ICD-10-CM | POA: Diagnosis not present

## 2019-06-22 DIAGNOSIS — E039 Hypothyroidism, unspecified: Secondary | ICD-10-CM | POA: Diagnosis not present

## 2019-06-22 DIAGNOSIS — Z7982 Long term (current) use of aspirin: Secondary | ICD-10-CM | POA: Diagnosis not present

## 2019-06-22 DIAGNOSIS — M8580 Other specified disorders of bone density and structure, unspecified site: Secondary | ICD-10-CM | POA: Diagnosis not present

## 2019-06-22 DIAGNOSIS — I10 Essential (primary) hypertension: Secondary | ICD-10-CM | POA: Diagnosis not present

## 2019-06-22 DIAGNOSIS — Z85820 Personal history of malignant melanoma of skin: Secondary | ICD-10-CM | POA: Diagnosis not present

## 2019-06-22 DIAGNOSIS — Z87442 Personal history of urinary calculi: Secondary | ICD-10-CM | POA: Diagnosis not present

## 2019-06-22 DIAGNOSIS — M810 Age-related osteoporosis without current pathological fracture: Secondary | ICD-10-CM | POA: Diagnosis not present

## 2019-06-22 DIAGNOSIS — E210 Primary hyperparathyroidism: Secondary | ICD-10-CM | POA: Diagnosis not present

## 2019-06-22 DIAGNOSIS — G4730 Sleep apnea, unspecified: Secondary | ICD-10-CM | POA: Diagnosis not present

## 2019-06-22 DIAGNOSIS — D351 Benign neoplasm of parathyroid gland: Secondary | ICD-10-CM | POA: Diagnosis not present

## 2019-06-22 DIAGNOSIS — Z79890 Hormone replacement therapy (postmenopausal): Secondary | ICD-10-CM | POA: Diagnosis not present

## 2019-06-22 DIAGNOSIS — Z79899 Other long term (current) drug therapy: Secondary | ICD-10-CM | POA: Diagnosis not present

## 2019-06-22 HISTORY — PX: PARATHYROIDECTOMY: SHX19

## 2019-06-29 DIAGNOSIS — E210 Primary hyperparathyroidism: Secondary | ICD-10-CM | POA: Diagnosis not present

## 2019-07-04 DIAGNOSIS — Z961 Presence of intraocular lens: Secondary | ICD-10-CM | POA: Diagnosis not present

## 2019-07-04 DIAGNOSIS — H11001 Unspecified pterygium of right eye: Secondary | ICD-10-CM | POA: Diagnosis not present

## 2019-07-04 DIAGNOSIS — H02831 Dermatochalasis of right upper eyelid: Secondary | ICD-10-CM | POA: Diagnosis not present

## 2019-07-04 DIAGNOSIS — H0100B Unspecified blepharitis left eye, upper and lower eyelids: Secondary | ICD-10-CM | POA: Diagnosis not present

## 2019-07-04 DIAGNOSIS — H02834 Dermatochalasis of left upper eyelid: Secondary | ICD-10-CM | POA: Diagnosis not present

## 2019-07-04 DIAGNOSIS — H527 Unspecified disorder of refraction: Secondary | ICD-10-CM | POA: Diagnosis not present

## 2019-07-04 DIAGNOSIS — H26493 Other secondary cataract, bilateral: Secondary | ICD-10-CM | POA: Diagnosis not present

## 2019-07-04 DIAGNOSIS — H43813 Vitreous degeneration, bilateral: Secondary | ICD-10-CM | POA: Diagnosis not present

## 2019-07-04 DIAGNOSIS — D23112 Other benign neoplasm of skin of right lower eyelid, including canthus: Secondary | ICD-10-CM | POA: Diagnosis not present

## 2019-07-04 DIAGNOSIS — H0100A Unspecified blepharitis right eye, upper and lower eyelids: Secondary | ICD-10-CM | POA: Diagnosis not present

## 2019-07-04 DIAGNOSIS — D23121 Other benign neoplasm of skin of left upper eyelid, including canthus: Secondary | ICD-10-CM | POA: Diagnosis not present

## 2019-07-04 DIAGNOSIS — H04123 Dry eye syndrome of bilateral lacrimal glands: Secondary | ICD-10-CM | POA: Diagnosis not present

## 2019-07-06 ENCOUNTER — Telehealth (INDEPENDENT_AMBULATORY_CARE_PROVIDER_SITE_OTHER): Payer: Medicare Other | Admitting: Osteopathic Medicine

## 2019-07-06 DIAGNOSIS — H04123 Dry eye syndrome of bilateral lacrimal glands: Secondary | ICD-10-CM

## 2019-07-06 DIAGNOSIS — R682 Dry mouth, unspecified: Secondary | ICD-10-CM

## 2019-07-06 NOTE — Telephone Encounter (Signed)
Patient advised.

## 2019-07-06 NOTE — Telephone Encounter (Signed)
Patient called into the office with complaint about dry lips, toenails and fingernails are curling under, peeling around the cuticle area and look nasty.  Patient went to the eye doctor and her eyes are very dry and patient is concerned that the lasix is causing all the dryness she is having.  She never had these issues until she started lasix.  Please Advise.

## 2019-07-06 NOTE — Telephone Encounter (Signed)
Can stop lasix  If BP goes up above 140/90, needs to RTC for BP check and discuss alternative Rx      5 mins spent   Records reviewed previous notes here, endocrine notes, HCTZ was d/c 07/2018 in favor of something that would help excrete more Ca++

## 2019-07-06 NOTE — Telephone Encounter (Signed)
07/06/2019  LVM for pt to call to discuss.  Charyl Bigger, CMA

## 2019-07-29 DIAGNOSIS — E210 Primary hyperparathyroidism: Secondary | ICD-10-CM | POA: Diagnosis not present

## 2019-07-29 DIAGNOSIS — E892 Postprocedural hypoparathyroidism: Secondary | ICD-10-CM | POA: Diagnosis not present

## 2019-08-09 ENCOUNTER — Other Ambulatory Visit: Payer: Self-pay | Admitting: Osteopathic Medicine

## 2019-08-17 ENCOUNTER — Encounter: Payer: Self-pay | Admitting: Osteopathic Medicine

## 2019-08-17 ENCOUNTER — Ambulatory Visit (INDEPENDENT_AMBULATORY_CARE_PROVIDER_SITE_OTHER): Payer: Medicare Other | Admitting: Osteopathic Medicine

## 2019-08-17 VITALS — BP 160/85 | HR 90 | Ht 64.0 in | Wt 168.0 lb

## 2019-08-17 DIAGNOSIS — H04129 Dry eye syndrome of unspecified lacrimal gland: Secondary | ICD-10-CM | POA: Diagnosis not present

## 2019-08-17 DIAGNOSIS — B351 Tinea unguium: Secondary | ICD-10-CM | POA: Diagnosis not present

## 2019-08-17 DIAGNOSIS — L600 Ingrowing nail: Secondary | ICD-10-CM | POA: Diagnosis not present

## 2019-08-17 DIAGNOSIS — K130 Diseases of lips: Secondary | ICD-10-CM

## 2019-08-17 DIAGNOSIS — Z8639 Personal history of other endocrine, nutritional and metabolic disease: Secondary | ICD-10-CM

## 2019-08-17 DIAGNOSIS — E039 Hypothyroidism, unspecified: Secondary | ICD-10-CM | POA: Diagnosis not present

## 2019-08-17 DIAGNOSIS — Z9009 Acquired absence of other part of head and neck: Secondary | ICD-10-CM | POA: Diagnosis not present

## 2019-08-17 DIAGNOSIS — M810 Age-related osteoporosis without current pathological fracture: Secondary | ICD-10-CM

## 2019-08-17 DIAGNOSIS — I10 Essential (primary) hypertension: Secondary | ICD-10-CM

## 2019-08-17 DIAGNOSIS — L853 Xerosis cutis: Secondary | ICD-10-CM | POA: Diagnosis not present

## 2019-08-17 DIAGNOSIS — E892 Postprocedural hypoparathyroidism: Secondary | ICD-10-CM

## 2019-08-17 LAB — COMPLETE METABOLIC PANEL WITH GFR
AG Ratio: 1.8 (calc) (ref 1.0–2.5)
ALT: 12 U/L (ref 6–29)
AST: 18 U/L (ref 10–35)
Albumin: 4.6 g/dL (ref 3.6–5.1)
Alkaline phosphatase (APISO): 55 U/L (ref 37–153)
BUN: 15 mg/dL (ref 7–25)
CO2: 26 mmol/L (ref 20–32)
Calcium: 9.6 mg/dL (ref 8.6–10.4)
Chloride: 108 mmol/L (ref 98–110)
Creat: 0.86 mg/dL (ref 0.60–0.93)
GFR, Est African American: 74 mL/min/{1.73_m2} (ref >=60)
GFR, Est Non African American: 64 mL/min/{1.73_m2} (ref >=60)
Globulin: 2.5 g/dL (calc) (ref 1.9–3.7)
Glucose, Bld: 91 mg/dL (ref 65–139)
Potassium: 4.2 mmol/L (ref 3.5–5.3)
Sodium: 141 mmol/L (ref 135–146)
Total Bilirubin: 0.7 mg/dL (ref 0.2–1.2)
Total Protein: 7.1 g/dL (ref 6.1–8.1)

## 2019-08-17 LAB — CBC
HCT: 41.9 % (ref 35.0–45.0)
Hemoglobin: 14.1 g/dL (ref 11.7–15.5)
MCH: 31.9 pg (ref 27.0–33.0)
MCHC: 33.7 g/dL (ref 32.0–36.0)
MCV: 94.8 fL (ref 80.0–100.0)
MPV: 11.2 fL (ref 7.5–12.5)
Platelets: 252 10*3/uL (ref 140–400)
RBC: 4.42 10*6/uL (ref 3.80–5.10)
RDW: 12.3 % (ref 11.0–15.0)
WBC: 5.8 10*3/uL (ref 3.8–10.8)

## 2019-08-17 LAB — T4, FREE: Free T4: 1.1 ng/dL (ref 0.8–1.8)

## 2019-08-17 LAB — TSH: TSH: 2.85 mIU/L (ref 0.40–4.50)

## 2019-08-17 MED ORDER — CICLOPIROX 8 % EX SOLN: Freq: Every day | CUTANEOUS | 11 refills | Status: DC

## 2019-08-17 NOTE — Progress Notes (Signed)
Jennifer Ray is a 79 y.o. female who presents to  [place] at [facility]  today, 08/17/19, seeking care for the following: . Dry eyes/lips . Tightness/tingling in the feet  . BP above goal today . Requesting labs for post-surgical care     ASSESSMENT & PLAN with other pertinent history/findings:  The primary encounter diagnosis was History of parathyroidectomy. Diagnoses of Age-related osteoporosis without current pathological fracture, Hypothyroidism, unspecified type, History of hyperparathyroidism, Dry eye, Dry lips, Dry skin, Onychomycosis, Ingrown fingernail, and White coat syndrome with diagnosis of hypertension were also pertinent to this visit.  1. History of parathyroidectomy Will get labs, cc to surgery   2. Age-related osteoporosis without current pathological fracture Checking calcium, vitamin D   3. Hypothyroidism, unspecified type TSH due, posisbly contirbuting to skin dryness?   4. History of hyperparathyroidism See #1  5. Dry eye 6. Dry lips 7. Dry skin Hydration encouraged  Labs pending No rash or dysphagia but would consider Sjogrens?  8. Onychomycosis 9. Ingrown fingernail Significant in fingernails as well Labs pending (Glc etc) Ciclopirox Rx renewed   10. White coat syndrome with diagnosis of hypertension Reports BP ok at home Monitor  BP Readings from Last 3 Encounters:  08/17/19 (!) 160/85  03/28/19 138/77  12/29/18 121/71      Patient Instructions  Labs today Refill nail medicine: see attached Rx and coupon     Orders Placed This Encounter  Procedures  . CBC  . COMPLETE METABOLIC PANEL WITH GFR  . TSH  . T4, free    Meds ordered this encounter  Medications  . ciclopirox (PENLAC) 8 % solution    Sig: Apply topically at bedtime. Apply over nail and surrounding skin. Apply daily over previous coat. After seven (7) days, may remove with alcohol and continue cycle. Please run w/  GoodRx coupon    Dispense:  12 mL    Refill:  11       Follow-up instructions: Return for on/after 08/26/19 for nurse visit [facility]. Annual w/ Dr Sheppard Coil in 6 mos. .                                         BP (!) 160/85   Pulse 90   Ht 5\' 4"  (1.626 m)   Wt 168 lb (76.2 kg)   SpO2 97%   BMI 28.84 kg/m   Current Meds  Medication Sig  . aspirin 81 MG tablet Take 1 tablet (81 mg total) by mouth daily.  . Chlorhexidine Gluconate 2 % SOLN For soaking infected finger. Immerse finger in solution for 10-15 mins, 4-5 times per day  . ciclopirox (PENLAC) 8 % solution Apply topically at bedtime. Apply over nail and surrounding skin. Apply daily over previous coat. After seven (7) days, may remove with alcohol and continue cycle. Please run w/ GoodRx coupon  . denosumab (PROLIA) 60 MG/ML SOSY injection Inject 60 mg into the skin every 6 (six) months. Administer in upper arm, thigh, or abdomen  . diclofenac sodium (VOLTAREN) 1 % GEL Apply 4 g topically 4 (four) times daily. Knee DJD.  Marland Kitchen levothyroxine (SYNTHROID) 25 MCG tablet TAKE 1 TABLET (25 MCG TOTAL) BY MOUTH DAILY BEFORE BREAKFAST.  Marland Kitchen losartan (COZAAR) 100 MG tablet Take 1 tablet (100 mg total) by mouth daily.  . Multiple Vitamin (MULTI-VITAMIN DAILY PO) Take by mouth.  Marland Kitchen  mupirocin ointment (BACTROBAN) 2 % Apply 1 application topically 4 (four) times daily. Apply to dry skin after chlorhexidine washes to affected area for 7-10 days.  . rosuvastatin (CRESTOR) 10 MG tablet TAKE 1 TABLET BY MOUTH EVERY DAY  . [DISCONTINUED] ciclopirox (PENLAC) 8 % solution Apply topically at bedtime. Apply over nail and surrounding skin. Apply daily over previous coat. After seven (7) days, may remove with alcohol and continue cycle. Please run w/ GoodRx coupon    Results for orders placed or performed in visit on 08/17/19 (from the past 72 hour(s))  CBC     Status: None   Collection Time: 08/17/19 10:48 AM  Result Value  Ref Range   WBC 5.8 3.8 - 10.8 Thousand/uL   RBC 4.42 3.80 - 5.10 Million/uL   Hemoglobin 14.1 11.7 - 15.5 g/dL   HCT 41.9 35.0 - 45.0 %   MCV 94.8 80.0 - 100.0 fL   MCH 31.9 27.0 - 33.0 pg   MCHC 33.7 32.0 - 36.0 g/dL   RDW 12.3 11.0 - 15.0 %   Platelets 252 140 - 400 Thousand/uL   MPV 11.2 7.5 - 12.5 fL  TSH     Status: None   Collection Time: 08/17/19 10:48 AM  Result Value Ref Range   TSH 2.85 0.40 - 4.50 mIU/L  T4, free     Status: None   Collection Time: 08/17/19 10:48 AM  Result Value Ref Range   Free T4 1.1 0.8 - 1.8 ng/dL    No results found.  Depression screen [facility] 2/9 12/29/2018 05/03/2018 12/28/2017  Decreased Interest 0 0 0  Down, Depressed, Hopeless 0 0 0  PHQ - 2 Score 0 0 0  Altered sleeping - - -  Tired, decreased energy - - -  Change in appetite - - -  Feeling bad or failure about yourself  - - -  Trouble concentrating - - -  Moving slowly or fidgety/restless - - -  Suicidal thoughts - - -  PHQ-9 Score - - -    GAD 7 : Generalized Anxiety Score 05/03/2018 07/30/2017  Nervous, Anxious, on Edge 0 0  Control/stop worrying 0 0  Worry too much - different things 0 0  Trouble relaxing 0 0  Restless 0 0  Easily annoyed or irritable 0 0  Afraid - awful might happen 0 0  Total GAD 7 Score 0 0      All questions at time of visit were answered - patient instructed to contact office with any additional concerns or updates.  ER/RTC precautions were reviewed with the patient.  Please note: voice recognition software was used to produce this document, and typos may escape review. Please contact Dr. Sheppard Coil for any needed clarifications.

## 2019-08-17 NOTE — Patient Instructions (Signed)
Labs today Refill nail medicine: see attached Rx and coupon

## 2019-08-23 ENCOUNTER — Telehealth: Payer: Self-pay

## 2019-08-23 NOTE — Telephone Encounter (Signed)
I don't think I sent anything there Can print and fax

## 2019-08-23 NOTE — Telephone Encounter (Signed)
Faxed to Dr Jenell Milliner.

## 2019-08-23 NOTE — Telephone Encounter (Signed)
[name] called for results of most recent labs. Advised of results. She wanted to know if those results have been faxed to Dr Jenell Milliner office.

## 2019-11-06 ENCOUNTER — Other Ambulatory Visit: Payer: Self-pay | Admitting: Osteopathic Medicine

## 2019-12-19 ENCOUNTER — Other Ambulatory Visit: Payer: Self-pay | Admitting: Osteopathic Medicine

## 2019-12-19 DIAGNOSIS — Z1231 Encounter for screening mammogram for malignant neoplasm of breast: Secondary | ICD-10-CM

## 2019-12-24 ENCOUNTER — Other Ambulatory Visit: Payer: Self-pay | Admitting: Osteopathic Medicine

## 2019-12-24 DIAGNOSIS — Z23 Encounter for immunization: Secondary | ICD-10-CM | POA: Diagnosis not present

## 2020-01-02 ENCOUNTER — Ambulatory Visit: Payer: Medicare Other

## 2020-01-03 DIAGNOSIS — L570 Actinic keratosis: Secondary | ICD-10-CM | POA: Diagnosis not present

## 2020-01-03 DIAGNOSIS — D485 Neoplasm of uncertain behavior of skin: Secondary | ICD-10-CM | POA: Diagnosis not present

## 2020-01-03 DIAGNOSIS — L820 Inflamed seborrheic keratosis: Secondary | ICD-10-CM | POA: Diagnosis not present

## 2020-01-03 DIAGNOSIS — L821 Other seborrheic keratosis: Secondary | ICD-10-CM | POA: Diagnosis not present

## 2020-01-14 DIAGNOSIS — Z23 Encounter for immunization: Secondary | ICD-10-CM | POA: Diagnosis not present

## 2020-01-26 ENCOUNTER — Other Ambulatory Visit: Payer: Self-pay

## 2020-01-26 ENCOUNTER — Ambulatory Visit (INDEPENDENT_AMBULATORY_CARE_PROVIDER_SITE_OTHER): Payer: Medicare Other

## 2020-01-26 DIAGNOSIS — Z1231 Encounter for screening mammogram for malignant neoplasm of breast: Secondary | ICD-10-CM | POA: Diagnosis not present

## 2020-03-15 DIAGNOSIS — L218 Other seborrheic dermatitis: Secondary | ICD-10-CM | POA: Diagnosis not present

## 2020-03-15 DIAGNOSIS — Z86008 Personal history of in-situ neoplasm of other site: Secondary | ICD-10-CM | POA: Diagnosis not present

## 2020-03-15 DIAGNOSIS — L821 Other seborrheic keratosis: Secondary | ICD-10-CM | POA: Diagnosis not present

## 2020-03-15 DIAGNOSIS — Z85820 Personal history of malignant melanoma of skin: Secondary | ICD-10-CM | POA: Diagnosis not present

## 2020-03-15 DIAGNOSIS — L308 Other specified dermatitis: Secondary | ICD-10-CM | POA: Diagnosis not present

## 2020-03-20 ENCOUNTER — Telehealth: Payer: Self-pay | Admitting: General Practice

## 2020-03-20 NOTE — Telephone Encounter (Signed)
She's returning your phone call and left a voicemail for you to call her back

## 2020-03-26 ENCOUNTER — Ambulatory Visit (INDEPENDENT_AMBULATORY_CARE_PROVIDER_SITE_OTHER): Payer: Medicare Other | Admitting: Osteopathic Medicine

## 2020-03-26 DIAGNOSIS — Z0000 Encounter for general adult medical examination without abnormal findings: Secondary | ICD-10-CM | POA: Diagnosis not present

## 2020-03-26 NOTE — Progress Notes (Signed)
MEDICARE ANNUAL WELLNESS VISIT  03/26/2020  Telephone Visit Disclaimer This Medicare AWV was conducted by telephone due to national recommendations for restrictions regarding the COVID-19 Pandemic (e.g. social distancing).  I verified, using two identifiers, that I am speaking with Jennifer Ray or their authorized healthcare agent. I discussed the limitations, risks, security, and privacy concerns of performing an evaluation and management service by telephone and the potential availability of an in-person appointment in the future. The patient expressed understanding and agreed to proceed.  Location of Patient: Home Location of Provider (nurse):  In the office.  Subjective:    Jennifer Ray is a 79 y.o. female patient of Sunnie Nielsen, DO who had a Medicare Annual Wellness Visit today via telephone. Jennifer Ray is Retired and lives alone but temporarily living with daughter due to covid. she has 2 children. she reports that she is socially active and does interact with friends/family regularly. she is moderately physically active and enjoys reading.  Patient Care Team: Sunnie Nielsen, DO as PCP - General (Osteopathic Medicine)  Advanced Directives 03/26/2020 12/29/2018 12/28/2017  Does Patient Have a Medical Advance Directive? Yes Yes Yes  Type of Estate agent of [place];Living will Healthcare Power of [place];Living will Healthcare Power of [place];Living will  Does patient want to make changes to medical advance directive? No - Patient declined No - Patient declined Yes (MAU/Ambulatory/Procedural Areas - Information given)  Copy of Healthcare Power of Attorney in Chart? Yes - validated most recent copy scanned in chart (See row information) No - copy requested No - copy requested  Would patient like information on creating a medical advance directive? No - Patient declined [facility] Utilization Over the Past 12 Months: # of  hospitalizations or ER visits: 0 # of surgeries: 0  Review of Systems    Patient reports that her overall health is unchanged compared to last year.  History obtained from chart review and the patient  Patient Reported Readings (BP, Pulse, CBG, Weight, etc) none  Pain Assessment Pain : No/denies pain     Current Medications & Allergies (verified) Allergies as of 03/26/2020   No Known Allergies     Medication List       Accurate as of March 26, 2020 11:39 AM. If you have any questions, ask your nurse or doctor.        aspirin 81 MG tablet Take 1 tablet (81 mg total) by mouth daily.   Chlorhexidine Gluconate 2 % Soln For soaking infected finger. Immerse finger in solution for 10-15 mins, 4-5 times per day   ciclopirox 8 % solution Commonly known as: PENLAC Apply topically at bedtime. Apply over nail and surrounding skin. Apply daily over previous coat. After seven (7) days, may remove with alcohol and continue cycle. Please run w/ GoodRx coupon   denosumab 60 MG/ML Sosy injection Commonly known as: PROLIA Inject 60 mg into the skin every 6 (six) months. Administer in upper arm, thigh, or abdomen   diclofenac sodium 1 % Gel Commonly known as: VOLTAREN Apply 4 g topically 4 (four) times daily. Knee DJD.   levothyroxine 25 MCG tablet Commonly known as: SYNTHROID TAKE 1 TABLET (25 MCG TOTAL) BY MOUTH DAILY BEFORE BREAKFAST.   losartan 100 MG tablet Commonly known as: COZAAR Take 1 tablet (100 mg total) by mouth daily.   MULTI-VITAMIN DAILY PO Take by mouth.   mupirocin ointment 2 % Commonly known as: Bactroban Apply 1 application topically 4 (four)  times daily. Apply to dry skin after chlorhexidine washes to affected area for 7-10 days.   rosuvastatin 10 MG tablet Commonly known as: CRESTOR TAKE 1 TABLET BY MOUTH EVERY DAY       History (reviewed): Past Medical History:  Diagnosis Date  . Hiatal hernia   . Hypercholesteremia   . Hypertension   .  Hypothyroid   . Melanoma ([place])    removed   Past Surgical History:  Procedure Laterality Date  . PARATHYROIDECTOMY  06/22/2019  . TONSILLECTOMY AND ADENOIDECTOMY    . TUBAL LIGATION     Family History  Problem Relation Age of Onset  . Cancer Mother        Breast  . Heart attack Mother   . Cancer Sister        breast  . Hypertension Father    Social History   Socioeconomic History  . Marital status: Divorced    Spouse name: Not on file  . Number of children: 2  . Years of education: 52  . Highest education level: 12th grade  Occupational History  . Occupation: retired    Comment: Catering manager  Tobacco Use  . Smoking status: Never Smoker  . Smokeless tobacco: Never Used  Vaping Use  . Vaping Use: Never used  Substance and Sexual Activity  . Alcohol use: No  . Drug use: No  . Sexual activity: Not Currently  Other Topics Concern  . Not on file  Social History Narrative   Patient walks everyday nad does stairs everyday. Eats very healthy a lot of vegetables.   Social Determinants of Health   Financial Resource Strain: Low Risk   . Difficulty of Paying Living Expenses: Not hard at all  Food Insecurity: No Food Insecurity  . Worried About Charity fundraiser in the Last Year: Never true  . Ran Out of Food in the Last Year: Never true  Transportation Needs: No Transportation Needs  . Lack of Transportation (Medical): No  . Lack of Transportation (Non-Medical): No  Physical Activity: Sufficiently Active  . Days of Exercise per Week: 7 days  . Minutes of Exercise per Session: 30 min  Stress: No Stress Concern Present  . Feeling of Stress : Not at all  Social Connections: Moderately Isolated  . Frequency of Communication with Friends and Family: More than three times a week  . Frequency of Social Gatherings with Friends and Family: Once a week  . Attends Religious Services: Never  . Active Member of Clubs or Organizations: Yes  . Attends Theatre manager Meetings: More than 4 times per year  . Marital Status: Divorced    Activities of Daily Living In your present state of health, do you have any difficulty performing the following activities: 03/26/2020  Hearing? Y  Comment has hearing aids.  Vision? N  Difficulty concentrating or making decisions? N  Walking or climbing stairs? N  Dressing or bathing? N  Doing errands, shopping? N  Preparing Food and eating ? N  Using the Toilet? N  In the past six months, have you accidently leaked urine? N  Do you have problems with loss of bowel control? N  Managing your Medications? N  Managing your Finances? N  Housekeeping or managing your Housekeeping? N  Some recent data might be hidden    Patient Education/ Literacy How often do you need to have someone help you when you read instructions, pamphlets, or other written materials from your doctor or pharmacy?:  1 - Never What is the last grade level you completed in school?: Bachelor's degree.  Exercise Current Exercise Habits: Home exercise routine, Type of exercise: walking, Time (Minutes): 25, Frequency (Times/Week): 7, Weekly Exercise (Minutes/Week): 175, Intensity: Moderate  Diet Patient reports consuming 3 meals a day and 1 snack(s) a day Patient reports that her primary diet is: Low fat Patient reports that she does have regular access to food.   Depression Screen PHQ 2/9 Scores 03/26/2020 12/29/2018 05/03/2018 12/28/2017 07/30/2017  PHQ - 2 Score 0 0 0 0 0  PHQ- 9 Score - - - - 0     Fall Risk Fall Risk  03/26/2020 12/29/2018 12/28/2017  Falls in the past year? 0 0 No  Number falls in past yr: 0 0 -  Injury with Fall? 0 0 -  Risk for fall due to : No Fall Risks - -  Follow up Falls evaluation completed Falls prevention discussed -     Objective:  Jennifer Ray seemed alert and oriented and she participated appropriately during our telephone visit.  Blood Pressure Weight BMI  BP Readings from Last 3  Encounters:  08/17/19 (!) 160/85  03/28/19 138/77  12/29/18 121/71   Wt Readings from Last 3 Encounters:  08/17/19 168 lb (76.2 kg)  03/28/19 174 lb (78.9 kg)  12/29/18 173 lb (78.5 kg)   BMI Readings from Last 1 Encounters:  08/17/19 28.84 kg/m    *Unable to obtain current vital signs, weight, and BMI due to telephone visit type  Hearing/Vision  . Jennifer Ray did not seem to have difficulty with hearing/understanding during the telephone conversation . Reports that she has had a formal eye exam by an eye care professional within the past year . Reports that she has not had a formal hearing evaluation within the past year *Unable to fully assess hearing and vision during telephone visit type  Cognitive Function: 6CIT Screen 03/26/2020 12/29/2018 12/28/2017  What Year? 0 points 0 points 0 points  What month? 0 points 0 points 0 points  What time? 0 points 0 points 0 points  Count back from 20 0 points 0 points 0 points  Months in reverse 0 points 0 points 0 points  Repeat phrase 2 points 0 points 0 points  Total Score 2 0 0   (Normal:0-7, Significant for Dysfunction: >8)  Normal Cognitive Function Screening: Yes   Immunization & Health Maintenance Record Immunization History  Administered Date(s) Administered  . Influenza-Unspecified 11/22/2017, 11/29/2018, 01/14/2020  . PFIZER SARS-COV-2 Vaccination 05/05/2019, 05/26/2019, 12/24/2019  . Pneumococcal Polysaccharide-23 12/29/2018  . Tdap 07/30/2017    Health Maintenance  Topic Date Due  . PNA vac Low Risk Adult (2 of 2 - PCV13) 07/28/2020 (Originally 12/29/2019)  . COVID-19 Vaccine (4 - Booster for Pfizer series) 06/22/2020  . TETANUS/TDAP  07/31/2027  . INFLUENZA VACCINE  Completed  . DEXA SCAN  Completed       Assessment  This is a routine wellness examination for Jennifer Ray.  Health Maintenance: Due or Overdue There are no preventive care reminders to display for this patient.  [place] does  not need a referral for [employer] Assistance: Care Management:   no Social Work:    no Prescription Assistance:  no Nutrition/Diabetes Education:  no   Plan:  Personalized Goals Goals Addressed            This Visit's Progress   . Patient Stated       03/26/2020 AWV Goal: Exercise for  General Health   Patient will verbalize understanding of the benefits of increased physical activity:  Exercising regularly is important. It will improve your overall fitness, flexibility, and endurance.  Regular exercise also will improve your overall health. It can help you control your weight, reduce stress, and improve your bone density.  Over the next year, patient will increase physical activity as tolerated with a goal of at least 150 minutes of moderate physical activity per week.   You can tell that you are exercising at a moderate intensity if your heart starts beating faster and you start breathing faster but can still hold a conversation.  Moderate-intensity exercise ideas include:  Walking 1 mile (1.6 km) in about 15 minutes  Biking  Hiking  Golfing  Dancing  Water aerobics  Patient will verbalize understanding of everyday activities that increase physical activity by providing examples like the following: ? Yard work, such as: ? Pushing a Conservation officer, nature ? Raking and bagging leaves ? Washing your car ? Pushing a stroller ? Shoveling snow ? Gardening ? Washing windows or floors  Patient will be able to explain general safety guidelines for exercising:   Before you start a new exercise program, talk with your health care provider.  Do not exercise so much that you hurt yourself, feel dizzy, or get very short of breath.  Wear comfortable clothes and wear shoes with good support.  Drink plenty of water while you exercise to prevent dehydration or heat stroke.  Work out until your breathing and your heartbeat get faster.   Marland Kitchenawv      Personalized Health Maintenance  & Screening Recommendations  Pneumococcal vaccine  Patient states she will check with her pharmacy related to the Prevnar vaccine and will bring in the documentation.   Lung Cancer Screening Recommended: no (Low Dose CT Chest recommended if Age 66-80 years, 30 pack-year currently smoking OR have quit w/in past 15 years) Hepatitis C Screening recommended: not applicable HIV Screening recommended: not applicable  Advanced Directives: Written information was not prepared per patient's request.  Referrals & Orders No orders of the defined types were placed in this encounter.   Follow-up Plan . Follow-up with Emeterio Reeve, DO as planned . Schedule your next wellness exam in one year. .    I have personally reviewed and noted the following in the patient's chart:   . Medical and social history . Use of alcohol, tobacco or illicit drugs  . Current medications and supplements . Functional ability and status . Nutritional status . Physical activity . Advanced directives . List of other physicians . Hospitalizations, surgeries, and ER visits in previous 12 months . Vitals . Screenings to include cognitive, depression, and falls . Referrals and appointments  In addition, I have reviewed and discussed with Burnett Kanaris certain preventive protocols, quality metrics, and best practice recommendations. A written personalized care plan for preventive services as well as general preventive health recommendations is available and can be mailed to the patient at her request.      Tinnie Gens  03/26/2020

## 2020-03-26 NOTE — Patient Instructions (Signed)
  MEDICARE ANNUAL WELLNESS VISIT Health Maintenance Summary and Written Plan of Care  [name] ,  Thank you for allowing me to perform your Medicare Annual Wellness Visit and for your ongoing commitment to your health.   Health Maintenance & Immunization History Health Maintenance  Topic Date Due  . PNA vac Low Risk Adult (2 of 2 - PCV13) 07/28/2020 (Originally 12/29/2019)  . COVID-19 Vaccine (4 - Booster for Pfizer series) 06/22/2020  . TETANUS/TDAP  07/31/2027  . INFLUENZA VACCINE  Completed  . DEXA SCAN  Completed   Immunization History  Administered Date(s) Administered  . Influenza-Unspecified 11/22/2017, 11/29/2018, 01/14/2020  . PFIZER SARS-COV-2 Vaccination 05/05/2019, 05/26/2019, 12/24/2019  . Pneumococcal Polysaccharide-23 12/29/2018  . Tdap 07/30/2017    These are the patient goals that we discussed: Goals Addressed            This Visit's Progress   . Patient Stated       03/26/2020 AWV Goal: Exercise for General Health   Patient will verbalize understanding of the benefits of increased physical activity:  Exercising regularly is important. It will improve your overall fitness, flexibility, and endurance.  Regular exercise also will improve your overall health. It can help you control your weight, reduce stress, and improve your bone density.  Over the next year, patient will increase physical activity as tolerated with a goal of at least 150 minutes of moderate physical activity per week.   You can tell that you are exercising at a moderate intensity if your heart starts beating faster and you start breathing faster but can still hold a conversation.  Moderate-intensity exercise ideas include:  Walking 1 mile (1.6 km) in about 15 minutes  Biking  Hiking  Golfing  Dancing  Water aerobics  Patient will verbalize understanding of everyday activities that increase physical activity by providing examples like the following: ? Yard work, such  as: ? Pushing a Surveyor, mining ? Raking and bagging leaves ? Washing your car ? Pushing a stroller ? Shoveling snow ? Gardening ? Washing windows or floors  Patient will be able to explain general safety guidelines for exercising:   Before you start a new exercise program, talk with your health care provider.  Do not exercise so much that you hurt yourself, feel dizzy, or get very short of breath.  Wear comfortable clothes and wear shoes with good support.  Drink plenty of water while you exercise to prevent dehydration or heat stroke.  Work out until your breathing and your heartbeat get faster.   Marland Kitchenawv        This is a list of Health Maintenance Items that are overdue or due now: There are no preventive care reminders to display for this patient.   Orders/Referrals Placed Today: No orders of the defined types were placed in this encounter.  (Contact our referral department at [phone] if you have not spoken with someone about your referral appointment within the next 5 days)    Follow-up Plan . Follow-up with Sunnie Nielsen, DO as planned . Schedule your next wellness exam in one year.

## 2020-05-01 ENCOUNTER — Other Ambulatory Visit: Payer: Self-pay | Admitting: Osteopathic Medicine

## 2020-07-20 ENCOUNTER — Telehealth: Payer: Self-pay | Admitting: General Practice

## 2020-07-20 NOTE — Telephone Encounter (Signed)
Transition Care Management Unsuccessful Follow-up Telephone Call  Date of discharge and from where:  [facility] 07/19/20  Attempts:  1st Attempt  Reason for unsuccessful TCM follow-up call:  Left voice message

## 2020-07-25 ENCOUNTER — Telehealth (INDEPENDENT_AMBULATORY_CARE_PROVIDER_SITE_OTHER): Payer: Medicare Other | Admitting: Medical-Surgical

## 2020-07-25 ENCOUNTER — Encounter: Payer: Self-pay | Admitting: Medical-Surgical

## 2020-07-25 ENCOUNTER — Telehealth: Payer: Medicare Other | Admitting: Osteopathic Medicine

## 2020-07-25 DIAGNOSIS — S60511D Abrasion of right hand, subsequent encounter: Secondary | ICD-10-CM | POA: Diagnosis not present

## 2020-07-25 DIAGNOSIS — W5503XA Scratched by cat, initial encounter: Secondary | ICD-10-CM

## 2020-07-25 DIAGNOSIS — W5503XD Scratched by cat, subsequent encounter: Secondary | ICD-10-CM | POA: Diagnosis not present

## 2020-07-25 NOTE — Progress Notes (Signed)
Virtual Visit via Telephone   I connected with  Jennifer Ray  on 07/25/20 by telephone/telehealth and verified that I am speaking with the correct person using two identifiers.   I discussed the limitations, risks, security and privacy concerns of performing an evaluation and management service by telephone, including the higher likelihood of inaccurate diagnosis and treatment, and the availability of in person appointments.  We also discussed the likely need of an additional face to face encounter for complete and high quality delivery of care.  I also discussed with the patient that there may be a patient responsible charge related to this service. The patient expressed understanding and wishes to proceed.  Provider location is in medical facility. Patient location is at their home, different from provider location. People involved in care of the patient during this telehealth encounter were myself, my nurse/medical assistant, and my front office/scheduling team member.  CC: Hospital follow up  HPI: Pleasant 80 year old female presenting via telephone for a hospital follow-up.  Notes that she was over at her daughter's house when she sustained a scratch by her daughter's cat.  Scratches on the back of her hand and was minimal but she noticed swelling and erythema approximately an hour later.  Cat is up-to-date on vaccines but she was worried about possible infection.  Since it was late in the evening, she presented to the emergency room for evaluation.  There they prescribed her Augmentin twice daily for 10 days but they only gave her 10 pills.  They told her verbally she should take this for 5 days.  She has 1 more dose left, has been tolerating this well without side effects.  She is up-to-date on her tetanus shot and reports that the scratch is almost completely healed with no signs of infection or inflammation.  Review of Systems: See HPI for pertinent positives and negatives.    Objective Findings:    General: Speaking full sentences, no audible heavy breathing.  Sounds alert and appropriately interactive.    Impression and Recommendations:    1. Cat scratch Since she is healing well, no need to extend the Augmentin for another 5 days.  Advised to finish her last dose of the medication and continue to monitor for signs of infection.  Recommend avoiding cat scratches if at all possible!  I discussed the above assessment and treatment plan with the patient. The patient was provided an opportunity to ask questions and all were answered. The patient agreed with the plan and demonstrated an understanding of the instructions.   The patient was advised to call back or seek an in-person evaluation if the symptoms worsen or if the condition fails to improve as anticipated.  15 minutes of non-face-to-face time was provided during this encounter.  Return if symptoms worsen or fail to improve. ___________________________________________ Samuel Bouche, DNP, APRN, FNP-BC [facility] [place]

## 2020-07-29 ENCOUNTER — Other Ambulatory Visit: Payer: Self-pay | Admitting: Osteopathic Medicine

## 2020-08-02 NOTE — Telephone Encounter (Signed)
Transition Care Management Follow-up Telephone Call  Date of discharge and from where: [facility] 07/19/20  How have you been since you were released from the hospital? Doing ok. Patient had hospital follow up with Samuel Bouche on 07/25/20.  Any questions or concerns? No

## 2020-08-14 ENCOUNTER — Telehealth: Payer: Self-pay | Admitting: Osteopathic Medicine

## 2020-08-14 NOTE — Progress Notes (Signed)
  Chronic Care Management   Outreach Note  08/14/2020 Name: JAIDAH LOMAX MRN: [id number] DOB: 10-23-1940  Referred by: Emeterio Reeve, DO Reason for referral : No chief complaint on file.   An unsuccessful telephone outreach was attempted today. The patient was referred to the pharmacist for assistance with care management and care coordination.   Follow Up Plan:   Lauretta Grill Upstream Scheduler

## 2020-08-15 ENCOUNTER — Telehealth: Payer: Self-pay | Admitting: Osteopathic Medicine

## 2020-08-15 NOTE — Progress Notes (Signed)
  Chronic Care Management   Outreach Note  08/15/2020 Name: FRAYA UEDA MRN: [id number] DOB: 1940-06-28  Referred by: Emeterio Reeve, DO Reason for referral : No chief complaint on file.   A second unsuccessful telephone outreach was attempted today. The patient was referred to pharmacist for assistance with care management and care coordination.  Follow Up Plan:   Lauretta Grill Upstream Scheduler

## 2020-08-29 ENCOUNTER — Telehealth: Payer: Self-pay | Admitting: Osteopathic Medicine

## 2020-08-29 NOTE — Chronic Care Management (AMB) (Signed)
  Chronic Care Management   Note  08/29/2020 Name: LAURAN ROMANSKI MRN: [id number] DOB: 20-Apr-1940  [place] is a 80 y.o. year old female who is a primary care patient of Emeterio Reeve, DO. I reached out to [place] by phone today in response to a referral sent by Ms. Vesta PCP, Emeterio Reeve, DO.   Ms. Parsley was given information about Chronic Care Management services today including:  1. CCM service includes personalized support from designated clinical staff supervised by her physician, including individualized plan of care and coordination with other care providers 2. 24/7 contact phone numbers for assistance for urgent and routine care needs. 3. Service will only be billed when office clinical staff spend 20 minutes or more in a month to coordinate care. 4. Only one practitioner may furnish and bill the service in a calendar month. 5. The patient may stop CCM services at any time (effective at the end of the month) by phone call to the office staff.   Patient agreed to services and verbal consent obtained.   Follow up plan:   Lauretta Grill Upstream Scheduler

## 2020-10-02 ENCOUNTER — Encounter: Payer: Self-pay | Admitting: Family Medicine

## 2020-10-02 ENCOUNTER — Other Ambulatory Visit: Payer: Self-pay

## 2020-10-02 ENCOUNTER — Ambulatory Visit (INDEPENDENT_AMBULATORY_CARE_PROVIDER_SITE_OTHER): Payer: Medicare Other | Admitting: Family Medicine

## 2020-10-02 ENCOUNTER — Ambulatory Visit (INDEPENDENT_AMBULATORY_CARE_PROVIDER_SITE_OTHER): Payer: Medicare Other

## 2020-10-02 VITALS — BP 149/91 | HR 105 | Temp 97.9°F | Resp 17 | Wt 159.2 lb

## 2020-10-02 DIAGNOSIS — W19XXXA Unspecified fall, initial encounter: Secondary | ICD-10-CM

## 2020-10-02 DIAGNOSIS — S0990XA Unspecified injury of head, initial encounter: Secondary | ICD-10-CM

## 2020-10-02 DIAGNOSIS — R42 Dizziness and giddiness: Secondary | ICD-10-CM | POA: Diagnosis not present

## 2020-10-02 DIAGNOSIS — M79642 Pain in left hand: Secondary | ICD-10-CM

## 2020-10-02 LAB — GLUCOSE, POCT (MANUAL RESULT ENTRY): POC Glucose: 117 mg/dl — AB (ref 70–99)

## 2020-10-02 MED ORDER — MECLIZINE HCL 25 MG PO TABS: 25.0000 mg | ORAL_TABLET | Freq: Three times a day (TID) | ORAL | 0 refills | Status: AC | PRN

## 2020-10-02 NOTE — Addendum Note (Signed)
Addended by: Dema Severin on: 10/02/2020 12:58 PM   Modules accepted: Orders

## 2020-10-02 NOTE — Progress Notes (Signed)
No acute findings on head CT.

## 2020-10-02 NOTE — Progress Notes (Signed)
No acute findings on hand/wrist xray.

## 2020-10-02 NOTE — Patient Instructions (Signed)
If you have trouble doing these on your own at home, you can look up good videos on YouTube of "Epley Maneuver." If you decide you want to see a physical therapist for this, let me know.  If symptoms worsen and you decide you need some medication to help with the dizziness, then please let us know.

## 2020-10-02 NOTE — Progress Notes (Signed)
Acute Office Visit  Subjective:    Patient ID: Jennifer Ray, female    DOB: 07-02-1940, 80 y.o.   MRN: [id number]  Chief Complaint  Patient presents with   Dizziness     HPI Patient is in today for dizziness x 1 day.   Patient states she woke up around 5am today to use the bathroom and she was dizzy and lightheaded. She went back to bed, but was still feeling the same way when she got back up. She states it is spinning/dizzy sensation that is aggravated by movements. States "I've never had vertigo, but this is what I imagine it would feel like." No recent colds/illnesses. She denies nausea, vomiting, diarrhea, fevers, chest pain, shortness of breath syncope, falls, vision/hearing changes, headache, diaphoresis, weakness, slurred speech, photo/phonophobia.  States she is doing well overall. Only other complaint is her feet falling asleep occasionally and feeling sore/tight. She has seen PCP for this. No edema, decreased sensation, falls, pain, temperature changes, skin changes. Denies any acute changes or concerns. Not worsening at all.     Past Medical History:  Diagnosis Date   Hiatal hernia    Hypercholesteremia    Hypertension    Hypothyroid    Melanoma ([place])    removed    Past Surgical History:  Procedure Laterality Date   PARATHYROIDECTOMY  06/22/2019   TONSILLECTOMY AND ADENOIDECTOMY     TUBAL LIGATION      Family History  Problem Relation Age of Onset   Cancer Mother        Breast   Heart attack Mother    Cancer Sister        breast   Hypertension Father     Social History   Socioeconomic History   Marital status: Divorced    Spouse name: Not on file   Number of children: 2   Years of education: 16   Highest education level: 12th grade  Occupational History   Occupation: retired    Comment: Catering manager  Tobacco Use   Smoking status: Never   Smokeless tobacco: Never  Scientific laboratory technician Use: Never used  Substance and Sexual  Activity   Alcohol use: No   Drug use: No   Sexual activity: Not Currently  Other Topics Concern   Not on file  Social History Narrative   Patient walks everyday nad does stairs everyday. Eats very healthy a lot of vegetables.   Social Determinants of Health   Financial Resource Strain: Low Risk    Difficulty of Paying Living Expenses: Not hard at all  Food Insecurity: No Food Insecurity   Worried About Charity fundraiser in the Last Year: Never true   [place] in the Last Year: Never true  Transportation Needs: No Transportation Needs   Lack of Transportation (Medical): No   Lack of Transportation (Non-Medical): No  Physical Activity: Sufficiently Active   Days of Exercise per Week: 7 days   Minutes of Exercise per Session: 30 min  Stress: No Stress Concern Present   Feeling of Stress : Not at all  Social Connections: Moderately Isolated   Frequency of Communication with Friends and Family: More than three times a week   Frequency of Social Gatherings with Friends and Family: Once a week   Attends Religious Services: Never   Marine scientist or Organizations: Yes   Attends Music therapist: More than 4 times per year   Marital Status: Divorced  Intimate Partner Violence: Not At Risk   Fear of Current or Ex-Partner: No   Emotionally Abused: No   Physically Abused: No   Sexually Abused: No    Outpatient Medications Prior to Visit  Medication Sig Dispense Refill   aspirin 81 MG tablet Take 1 tablet (81 mg total) by mouth daily. 90 tablet 3   Chlorhexidine Gluconate 2 % SOLN For soaking infected finger. Immerse finger in solution for 10-15 mins, 4-5 times per day 250 mL 1   ciclopirox (PENLAC) 8 % solution Apply topically at bedtime. Apply over nail and surrounding skin. Apply daily over previous coat. After seven (7) days, may remove with alcohol and continue cycle. Please run w/ GoodRx coupon 12 mL 11   diclofenac sodium (VOLTAREN) 1 % GEL Apply 4  g topically 4 (four) times daily. Knee DJD. 100 g 11   hydrocortisone 2.5 % cream      levothyroxine (SYNTHROID) 25 MCG tablet TAKE 1 TABLET (25 MCG TOTAL) BY MOUTH DAILY BEFORE BREAKFAST. 90 tablet 3   losartan (COZAAR) 100 MG tablet TAKE 1 TABLET BY MOUTH EVERY DAY 90 tablet 0   Multiple Vitamin (MULTI-VITAMIN DAILY PO) Take by mouth.     mupirocin ointment (BACTROBAN) 2 % Apply 1 application topically 4 (four) times daily. Apply to dry skin after chlorhexidine washes to affected area for 7-10 days. 30 g 3   rosuvastatin (CRESTOR) 10 MG tablet TAKE 1 TABLET BY MOUTH EVERY DAY 90 tablet 0   No facility-administered medications prior to visit.    Allergies  Allergen Reactions   Tape Rash    Review of Systems All review of systems negative except what is listed in the HPI      Objective:    Physical Exam Vitals reviewed.  Constitutional:      Appearance: Normal appearance.  HENT:     Head: Normocephalic and atraumatic.     Comments: Positive Dix Hallpike, left more significant than right    Right Ear: Tympanic membrane normal.     Left Ear: Tympanic membrane normal.  Eyes:     Extraocular Movements: Extraocular movements intact.     Conjunctiva/sclera: Conjunctivae normal.     Pupils: Pupils are equal, round, and reactive to light.  Cardiovascular:     Rate and Rhythm: Normal rate and regular rhythm.     Pulses: Normal pulses.     Heart sounds: Normal heart sounds.  Pulmonary:     Effort: Pulmonary effort is normal.     Breath sounds: Normal breath sounds.  Musculoskeletal:        General: Normal range of motion.     Cervical back: Normal range of motion and neck supple.     Right lower leg: No edema.     Left lower leg: No edema.  Lymphadenopathy:     Cervical: No cervical adenopathy.  Skin:    General: Skin is warm and dry.  Neurological:     General: No focal deficit present.     Mental Status: She is oriented to person, place, and time. Mental status is at  baseline.     Cranial Nerves: No cranial nerve deficit.     Motor: No weakness.     Gait: Gait normal.  Psychiatric:        Mood and Affect: Mood normal.        Behavior: Behavior normal.        Thought Content: Thought content normal.  Judgment: Judgment normal.      BP 137/76   Pulse 72   Temp 97.9 F (36.6 C)   Resp 17   Wt 159 lb 3.2 oz (72.2 kg)   SpO2 97%   BMI 27.33 kg/m  Wt Readings from Last 3 Encounters:  10/02/20 159 lb 3.2 oz (72.2 kg)  08/17/19 168 lb (76.2 kg)  03/28/19 174 lb (78.9 kg)    Health Maintenance Due  Topic Date Due   Zoster Vaccines- Shingrix (1 of 2) Never done   PNA vac Low Risk Adult (2 of 2 - PCV13) 12/29/2019    There are no preventive care reminders to display for this patient.   Lab Results  Component Value Date   TSH 2.85 08/17/2019   Lab Results  Component Value Date   WBC 5.8 08/17/2019   HGB 14.1 08/17/2019   HCT 41.9 08/17/2019   MCV 94.8 08/17/2019   PLT 252 08/17/2019   Lab Results  Component Value Date   NA 141 08/17/2019   K 4.2 08/17/2019   CO2 26 08/17/2019   GLUCOSE 91 08/17/2019   BUN 15 08/17/2019   CREATININE 0.86 08/17/2019   BILITOT 0.7 08/17/2019   AST 18 08/17/2019   ALT 12 08/17/2019   PROT 7.1 08/17/2019   CALCIUM 9.6 08/17/2019   Lab Results  Component Value Date   CHOL 123 09/01/2018   Lab Results  Component Value Date   HDL 55 09/01/2018   Lab Results  Component Value Date   LDLCALC 49 09/01/2018   Lab Results  Component Value Date   TRIG 109 09/01/2018   Lab Results  Component Value Date   CHOLHDL 2.2 09/01/2018   No results found for: HGBA1C     Assessment & Plan:   1. Vertigo Positive Dix-Hallpike, likely vertigo. Patient is not interested in meclizine or physical therapy. States she prefers to try Printmaker at home - handout provided with exercise instructions. Patient aware of signs/symptoms requiring further/urgent evaluation.    2. Head  trauma/fall After checking out, patient fell at the entrance of the building. She states "I was feeling fine, but then everything started spinning and I just fell back." She did hit the back of her head and already has a knot forming. Also hit left hand on the door which is now swelling and painful. Will xray. Normal neuro exam. No acute vision changes (reports right eye has been slightly "cloudy" for awhile (hx of cataracts) - already has an appointment to see eye doctor next week. POCT CBG 117. CT scan of head and L wrist xray ordered. Will update them with results. Patient aware of signs/symptoms requiring further/urgent evaluation. Discussed case with Dr. Madilyn Fireman.   Changed her mind and would like the meclizine..   - meclizine (ANTIVERT) 25 MG tablet; Take 1 tablet (25 mg total) by mouth 3 (three) times daily as needed for dizziness.  Dispense: 30 tablet; Refill: 0 - CT HEAD WO CONTRAST - DG Wrist Complete Left     Follow-up if symptoms worsen or fail to improve.   [place] Nails Olevia Bowens, DNP, FNP-C

## 2020-10-05 ENCOUNTER — Telehealth: Payer: Self-pay | Admitting: Pharmacist

## 2020-10-05 NOTE — Chronic Care Management (AMB) (Signed)
    Chronic Care Management Pharmacy Assistant   Name: BRIETTA MANSO  MRN: [id number] DOB: 08-13-1940  Jennifer Ray is an 80 y.o. year old female who presents for her initial CCM visit with the clinical pharmacist.  Reason for Encounter: Initial CCM Visit    Recent office visits:  10/02/20 Caleen Jobs, NP Seen for vertigo due to traumatic head injury. Patient declined physical therapy.Start Meclizine 25 mg TID. Hand out provided for Epley Maneuver.CT scan of head and L wrist xray ordered. Follow up as needed.  07/25/20 Samuel Bouche, NP (Video Visit)Seen for a hospital follow up due to cat scratch on back of right hand. Follow up as needed.  Recent consult visits:  None noted  Hospital visits:  Medication Reconciliation was completed by comparing discharge summary, patient's EMR and Pharmacy list, and upon discussion with patient.  Admitted to the hospital on 07/19/20 due to cat scratch on right hand. Discharge date was 07/19/20. Discharged from [place]?Medications Started at [facility] Discharge:?? -started Augmentin 875-125 mg due to cat scratch  Medication Changes at Hospital Discharge: -Changed none  Medications Discontinued at Hospital Discharge: -Stopped none  Medications that remain the same after Hospital Discharge:??  -All other medications will remain the same.    Medications: Outpatient Encounter Medications as of 10/05/2020  Medication Sig   aspirin 81 MG tablet Take 1 tablet (81 mg total) by mouth daily.   Chlorhexidine Gluconate 2 % SOLN For soaking infected finger. Immerse finger in solution for 10-15 mins, 4-5 times per day   ciclopirox (PENLAC) 8 % solution Apply topically at bedtime. Apply over nail and surrounding skin. Apply daily over previous coat. After seven (7) days, may remove with alcohol and continue cycle. Please run w/ GoodRx coupon   diclofenac sodium (VOLTAREN) 1 % GEL Apply 4 g topically 4 (four) times daily. Knee DJD.    hydrocortisone 2.5 % cream    levothyroxine (SYNTHROID) 25 MCG tablet TAKE 1 TABLET (25 MCG TOTAL) BY MOUTH DAILY BEFORE BREAKFAST.   losartan (COZAAR) 100 MG tablet TAKE 1 TABLET BY MOUTH EVERY DAY   meclizine (ANTIVERT) 25 MG tablet Take 1 tablet (25 mg total) by mouth 3 (three) times daily as needed for dizziness.   Multiple Vitamin (MULTI-VITAMIN DAILY PO) Take by mouth.   mupirocin ointment (BACTROBAN) 2 % Apply 1 application topically 4 (four) times daily. Apply to dry skin after chlorhexidine washes to affected area for 7-10 days.   rosuvastatin (CRESTOR) 10 MG tablet TAKE 1 TABLET BY MOUTH EVERY DAY   No facility-administered encounter medications on file as of 10/05/2020.   Medications: aspirin 81 MG tablet  Chlorhexidine Gluconate 2 % SOLN ciclopirox (PENLAC) 8 % solution diclofenac sodium (VOLTAREN) 1 % GEL hydrocortisone 2.5 % cream last filled 03/15/2020 30 DS levothyroxine (SYNTHROID) 25 MCG tablet last filled 09/19/20 90 DS losartan (COZAAR) 100 MG tablet last filled 05/020/22 90 DS meclizine (ANTIVERT) 25 MG tablet last filled 10/02/20 10 DS Multiple Vitamin (MULTI-VITAMIN DAILY PO) mupirocin ointment (BACTROBAN) 2 % rosuvastatin (CRESTOR) 10 MG tablet last filled 09/10/20 90 DS    Star Rating Drugs: Rosuvastatin 10 mg last filled 09/10/20 90 DS Losartan 100 mg last filled 07/30/20 90 DS  [place] Pharmacist Assistant [phone]

## 2020-10-08 ENCOUNTER — Other Ambulatory Visit: Payer: Self-pay

## 2020-10-08 ENCOUNTER — Ambulatory Visit (INDEPENDENT_AMBULATORY_CARE_PROVIDER_SITE_OTHER): Payer: Medicare Other | Admitting: Pharmacist

## 2020-10-08 DIAGNOSIS — E7800 Pure hypercholesterolemia, unspecified: Secondary | ICD-10-CM

## 2020-10-08 DIAGNOSIS — I10 Essential (primary) hypertension: Secondary | ICD-10-CM | POA: Diagnosis not present

## 2020-10-08 NOTE — Progress Notes (Signed)
Chronic Care Management Pharmacy Note  10/09/2020 Name:  Jennifer Ray MRN:  [id number] DOB:  November 28, 1940  Summary: addressed HTN, HLD  Recommendations/Changes made from today's visit: none  Plan:  f/u with pharmacist in 6 months  Subjective: Jennifer Ray is an 80 y.o. year old female who is a primary patient of Emeterio Reeve, DO.  The CCM team was consulted for assistance with disease management and care coordination needs.    Engaged with patient by telephone for initial visit in response to provider referral for pharmacy case management and/or care coordination services.   Consent to Services:  The patient was given information about Chronic Care Management services, agreed to services, and gave verbal consent prior to initiation of services.  Please see initial visit note for detailed documentation.   Patient Care Team: Emeterio Reeve, DO as PCP - General (Osteopathic Medicine) Darius Bump, [facility] as Pharmacist (Pharmacist)  Recent office visits:  10/02/20 Caleen Jobs, NP Seen for vertigo due to traumatic head injury. Patient declined physical therapy.Start Meclizine 25 mg TID. Hand out provided for Epley Maneuver.CT scan of head and L wrist xray ordered. Follow up as needed.   07/25/20 Samuel Bouche, NP (Video Visit)Seen for a hospital follow up due to cat scratch on back of right hand. Follow up as needed.   Recent consult visits:  None noted   Hospital visits:  Medication Reconciliation was completed by comparing discharge summary, patient's EMR and Pharmacy list, and upon discussion with patient.   Admitted to the hospital on 07/19/20 due to cat scratch on right hand. Discharge date was 07/19/20. Discharged from [place]?Medications Started at [facility] Discharge:?? -started Augmentin 875-125 mg due to cat scratch   Medication Changes at Hospital Discharge: -Changed none   Medications Discontinued at Hospital Discharge: -Stopped none    Medications that remain the same after Hospital Discharge:?? -All other medications will remain the same.    Objective:  Lab Results  Component Value Date   CREATININE 0.86 08/17/2019   CREATININE 0.89 03/18/2019   CREATININE 0.90 09/01/2018    No results found for: HGBA1C Last diabetic Eye exam: No results found for: HMDIABEYEEXA  Last diabetic Foot exam: No results found for: HMDIABFOOTEX      Component Value Date/Time   CHOL 123 09/01/2018 0848   TRIG 109 09/01/2018 0848   HDL 55 09/01/2018 0848   CHOLHDL 2.2 09/01/2018 0848   LDLCALC 49 09/01/2018 0848    Hepatic Function Latest Ref Rng & Units 08/17/2019 09/01/2018 02/01/2018  Total Protein 6.1 - 8.1 g/dL 7.1 7.0 7.1  AST 10 - 35 U/L 18 18 23   ALT 6 - 29 U/L 12 12 14   Total Bilirubin 0.2 - 1.2 mg/dL 0.7 0.9 0.9    Lab Results  Component Value Date/Time   TSH 2.85 08/17/2019 10:48 AM   TSH 3.92 09/01/2018 08:48 AM   FREET4 1.1 08/17/2019 10:48 AM    CBC Latest Ref Rng & Units 08/17/2019 09/01/2018 02/01/2018  WBC 3.8 - 10.8 Thousand/uL 5.8 5.1 6.5  Hemoglobin 11.7 - 15.5 g/dL 14.1 13.9 13.4  Hematocrit 35.0 - 45.0 % 41.9 41.4 39.6  Platelets 140 - 400 Thousand/uL 252 232 248    Social History   Tobacco Use  Smoking Status Never  Smokeless Tobacco Never   BP Readings from Last 3 Encounters:  10/02/20 (!) 149/91  08/17/19 (!) 160/85  03/28/19 138/77   Pulse Readings from Last 3 Encounters:  10/02/20 Marland Kitchen)  105  08/17/19 90  03/28/19 86   Wt Readings from Last 3 Encounters:  10/02/20 159 lb 3.2 oz (72.2 kg)  08/17/19 168 lb (76.2 kg)  03/28/19 174 lb (78.9 kg)    Assessment: Review of patient past medical history, allergies, medications, health status, including review of consultants reports, laboratory and other test data, was performed as part of comprehensive evaluation and provision of chronic care management services.   SDOH:  (Social Determinants of Health) assessments and interventions performed:     CCM Care Plan  Allergies  Allergen Reactions   Tape Rash    Medications Reviewed Today     Reviewed by Darius Bump, [facility] (Pharmacist) on 10/08/20 at [place] List Status: <None>   Medication Order Taking? Sig Documenting Provider Last Dose Status Informant  aspirin 81 MG tablet [id number] Yes Take 1 tablet (81 mg total) by mouth daily. Emeterio Reeve, DO Taking Active   Chlorhexidine Gluconate 2 % SOLN [id number] No For soaking infected finger. Immerse finger in solution for 10-15 mins, 4-5 times per day  Patient not taking: Reported on 10/08/2020   Gregor Hams, MD Not Taking Active   ciclopirox [facility]) 8 % solution [id number] No Apply topically at bedtime. Apply over nail and surrounding skin. Apply daily over previous coat. After seven (7) days, may remove with alcohol and continue cycle. Please run w/ GoodRx coupon  Patient not taking: Reported on 10/08/2020   Emeterio Reeve, DO Not Taking Active   denosumab (PROLIA) 60 MG/ML SOSY injection [id number] Yes Inject 60 mg into the skin every 6 (six) months. [provider] Taking Active   diclofenac sodium (VOLTAREN) 1 % GEL [id number] Yes Apply 4 g topically 4 (four) times daily. Knee DJD. Gregor Hams, MD Taking Active   hydrocortisone 2.5 % cream [id number] Yes  [provider] Taking Active   levothyroxine (SYNTHROID) 25 MCG tablet [id number] Yes TAKE 1 TABLET (25 MCG TOTAL) BY MOUTH DAILY BEFORE BREAKFAST. Emeterio Reeve, DO Taking Active   losartan (COZAAR) 100 MG tablet [id number] Yes TAKE 1 TABLET BY MOUTH EVERY DAY Emeterio Reeve, DO Taking Active   meclizine (ANTIVERT) 25 MG tablet [id number] No Take 1 tablet (25 mg total) by mouth 3 (three) times daily as needed for dizziness.  Patient not taking: Reported on 10/08/2020   Terrilyn Saver, NP Not Taking Active   Multiple Vitamin (MULTI-VITAMIN DAILY PO) [id number] Yes Take by mouth. [provider] Taking Active   mupirocin ointment  (BACTROBAN) 2 % [id number] No Apply 1 application topically 4 (four) times daily. Apply to dry skin after chlorhexidine washes to affected area for 7-10 days.  Patient not taking: Reported on 10/08/2020   Emeterio Reeve, DO Not Taking Active   rosuvastatin (CRESTOR) 10 MG tablet [id number] Yes TAKE 1 TABLET BY MOUTH EVERY DAY Emeterio Reeve, DO Taking Active             Patient Active Problem List   Diagnosis Date Noted   Onychomycosis 12/28/2018   Ingrown fingernail 12/28/2018   Osteoporosis 01/13/2018   Right knee DJD 07/28/2017   Hypertension    Hypercholesteremia    Melanoma ([place])    Hypothyroid    Hiatal hernia     Immunization History  Administered Date(s) Administered   Influenza-Unspecified 11/22/2017, 11/29/2018, 01/14/2020   Moderna Sars-Covid-2 Vaccination 07/06/2020   PFIZER(Purple Top)SARS-COV-2 Vaccination 05/05/2019, 05/26/2019, 12/24/2019   Pneumococcal Polysaccharide-23 12/29/2018   Tdap 07/30/2017    Conditions to be addressed/monitored: HTN and  HLD  Care Plan : Medication Management  Updates made by Darius Bump, RPH since 10/09/2020 12:00 AM     Problem: HTN, HLD      Long-Range Goal: Disease Progression Prevention   Start Date: 10/08/2020  This Visit's Progress: On track  Priority: High  Note:   Current Barriers:  None at present  Pharmacist Clinical Goal(s):  Over the next 180 days, patient will maintain control of chronic conditions as evidenced by medication fill history, lab values, and vital signs  through collaboration with PharmD and provider.   Interventions: 1:1 collaboration with Emeterio Reeve, DO regarding development and update of comprehensive plan of care as evidenced by provider attestation and co-signature Inter-disciplinary care team collaboration (see longitudinal plan of care) Comprehensive medication review performed; medication list updated in electronic medical record  Hypertension:  Controlled; current  treatment:losartan 100mg  daily;   Current home readings: not currently checking, but does have cuff at home for PRN use  Denies hypotensive/hypertensive symptoms  Recommended continue current regimen and Hyperlipidemia:  Controlled; current treatment:rosuvastatin 10mg  daily;   Recommended continue current regimen  Patient Goals/Self-Care Activities Over the next 180 days, patient will:  take medications as prescribed  Follow Up Plan: Telephone follow up appointment with care management team member scheduled for:  6 months      Medication Assistance: None required.  Patient affirms current coverage meets needs.  Patient's preferred pharmacy is:  [id number] - [place], [place] [address] [address] Phone: [phone] Fax: [address], [place] [address] [place] [address] Phone: [phone] Fax: [phone]  Uses pill box? No - keep in bottles in certain locations of house, works well for her Pt endorses 95% compliance  Follow Up:  Patient agrees to Care Plan and Follow-up.  Plan: Telephone follow up appointment with care management team member scheduled for:  6 months  Darius Bump

## 2020-10-09 NOTE — Patient Instructions (Signed)
Visit Information   PATIENT GOALS:   Goals Addressed             This Visit's Progress    Medication Management       Patient Goals/Self-Care Activities Over the next 180 days, patient will:  take medications as prescribed  Follow Up Plan: Telephone follow up appointment with care management team member scheduled for:  6 months          Consent to CCM Services: Jennifer Ray was given information about Chronic Care Management services today including:  CCM service includes personalized support from designated clinical staff supervised by her physician, including individualized plan of care and coordination with other care providers 24/7 contact phone numbers for assistance for urgent and routine care needs. Service will only be billed when office clinical staff spend 20 minutes or more in a month to coordinate care. Only one practitioner may furnish and bill the service in a calendar month. The patient may stop CCM services at any time (effective at the end of the month) by phone call to the office staff. The patient will be responsible for cost sharing (co-pay) of up to 20% of the service fee (after annual deductible is met).  Patient agreed to services and verbal consent obtained.   Patient verbalizes understanding of instructions provided today and agrees to view in [place].   Telephone follow up appointment with care management team member scheduled for: 6 months  [place]: Patient Care Plan: Medication Management     Problem Identified: HTN, HLD      Long-Range Goal: Disease Progression Prevention   Start Date: 10/08/2020  This Visit's Progress: On track  Priority: High  Note:   Current Barriers:  None at present  Pharmacist Clinical Goal(s):  Over the next 180 days, patient will maintain control of chronic conditions as evidenced by medication fill history, lab values, and vital signs  through collaboration with PharmD and  provider.   Interventions: 1:1 collaboration with Emeterio Reeve, DO regarding development and update of comprehensive plan of care as evidenced by provider attestation and co-signature Inter-disciplinary care team collaboration (see longitudinal plan of care) Comprehensive medication review performed; medication list updated in electronic medical record  Hypertension:  Controlled; current treatment:losartan 182m daily;   Current home readings: not currently checking, but does have cuff at home for PRN use  Denies hypotensive/hypertensive symptoms  Recommended continue current regimen and Hyperlipidemia:  Controlled; current treatment:rosuvastatin 113mdaily;   Recommended continue current regimen  Patient Goals/Self-Care Activities Over the next 180 days, patient will:  take medications as prescribed  Follow Up Plan: Telephone follow up appointment with care management team member scheduled for:  6 months

## 2020-10-12 ENCOUNTER — Ambulatory Visit: Payer: Medicare Other | Admitting: Osteopathic Medicine

## 2020-10-18 HISTORY — PX: CAPSULOTOMY: SHX379

## 2020-10-25 ENCOUNTER — Ambulatory Visit (INDEPENDENT_AMBULATORY_CARE_PROVIDER_SITE_OTHER): Payer: Medicare Other | Admitting: Osteopathic Medicine

## 2020-10-25 ENCOUNTER — Encounter: Payer: Self-pay | Admitting: Osteopathic Medicine

## 2020-10-25 ENCOUNTER — Other Ambulatory Visit: Payer: Self-pay

## 2020-10-25 VITALS — BP 147/81 | HR 76 | Temp 97.7°F | Ht 64.0 in | Wt 157.0 lb

## 2020-10-25 DIAGNOSIS — I10 Essential (primary) hypertension: Secondary | ICD-10-CM

## 2020-10-25 DIAGNOSIS — E039 Hypothyroidism, unspecified: Secondary | ICD-10-CM

## 2020-10-25 DIAGNOSIS — E7800 Pure hypercholesterolemia, unspecified: Secondary | ICD-10-CM

## 2020-10-25 DIAGNOSIS — Z8639 Personal history of other endocrine, nutritional and metabolic disease: Secondary | ICD-10-CM

## 2020-10-25 DIAGNOSIS — M810 Age-related osteoporosis without current pathological fracture: Secondary | ICD-10-CM | POA: Diagnosis not present

## 2020-10-25 DIAGNOSIS — Z1231 Encounter for screening mammogram for malignant neoplasm of breast: Secondary | ICD-10-CM

## 2020-10-25 NOTE — Progress Notes (Signed)
Jennifer Ray is a 80 y.o. female who presents to  [place] at [facility]  today, 10/25/20, seeking care for the following:  Routine check chronic issues - see below      [place] with other pertinent findings:  The primary encounter diagnosis was Primary hypertension. Diagnoses of Hypercholesteremia, Age-related osteoporosis without current pathological fracture, Hypothyroidism, unspecified type, History of hyperparathyroidism, and Encounter for screening mammogram for malignant neoplasm of breast were also pertinent to this visit.   Primary hypertension - Plan: CBC, COMPLETE METABOLIC PANEL WITH GFR, Lipid panel, TSH, Hemoglobin A1c, Parathyroid hormone, intact (no Ca), Calcium, ionized  Hypercholesteremia - Plan: CBC, COMPLETE METABOLIC PANEL WITH GFR, Lipid panel, TSH, Hemoglobin A1c, Parathyroid hormone, intact (no Ca), Calcium, ionized  Age-related osteoporosis without current pathological fracture - Plan: CBC, COMPLETE METABOLIC PANEL WITH GFR, Lipid panel, TSH, Hemoglobin A1c, Parathyroid hormone, intact (no Ca), Calcium, ionized, DG Bone Density  Hypothyroidism, unspecified type - Plan: CBC, COMPLETE METABOLIC PANEL WITH GFR, Lipid panel, TSH, Hemoglobin A1c, Parathyroid hormone, intact (no Ca), Calcium, ionized, DG Bone Density  History of hyperparathyroidism - Plan: CBC, COMPLETE METABOLIC PANEL WITH GFR, Lipid panel, TSH, Hemoglobin A1c, Parathyroid hormone, intact (no Ca), Calcium, ionized, DG Bone Density  Encounter for screening mammogram for malignant neoplasm of breast - Plan: MM 3D SCREEN BREAST BILATERAL   Patient Instructions  FYI to my patients: After six years here, I will be leaving practice at [facility]. My last day here will be 12/28/2020. I will continue to provide your care up until that date, and you will still be considered a patient here after that as long as you want  to be!    After 12/28/2020, my patients have several options to continue care:  1) you can establish care with Dr. Luetta Nutting or Samuel Bouche NP, who are accepting new patients here and are absorbing many folks from my current patient panel, OR...  2) you can see any available provider here on as-needed basis until my official replacement starts (hiring a new doctor has not been finalized yet), OR.Marland KitchenMarland Kitchen 3) if you choose to seek care elsewhere, this office will be happy to facilitate transfer of records, and will refill medications on a case-by-case basis.    It is bittersweet to leave! I will be practicing inpatient hospital medicine at [facility], continuing to serve as chair for [employer], and I will also be teaching medical learners. I have truly enjoyed taking care of folks here, but I am also excited for my next adventure doing something a bit different.   Take care, and please let us know if you have any questions!   -Dr. Loni Muse.     Orders Placed This Encounter  Procedures   DG Bone Density   MM 3D SCREEN BREAST BILATERAL   CBC   COMPLETE METABOLIC PANEL WITH GFR   Lipid panel   TSH   Hemoglobin A1c   Parathyroid hormone, intact (no Ca)   Calcium, ionized    No orders of the defined types were placed in this encounter.    See below for relevant physical exam findings  See below for recent lab and imaging results reviewed  Medications, allergies, PMH, PSH, SocH, [place] reviewed below    Follow-up instructions: Return for [place] VISIT W/ NURSE. [place] .  Exam:  BP (!) 147/81   Pulse 76   Temp 97.7 F (36.5 C)   Ht 5' 4" (1.626 m)   Wt 157 lb (71.2 kg)   SpO2 99%   BMI 26.95 kg/m  Constitutional: VS see above. General Appearance: alert, well-developed, well-nourished, NAD Neck: No masses, trachea midline.  Respiratory: Normal respiratory effort. no wheeze,  no rhonchi, no rales Cardiovascular: S1/S2 normal, no murmur, no rub/gallop auscultated. RRR.  Musculoskeletal: Gait normal. Symmetric and independent movement of all extremities Abdominal: non-tender, non-distended, no appreciable organomegaly, neg Murphy's, BS [id number] Neurological: Normal balance/coordination. No tremor. Skin: warm, dry, intact.  Psychiatric: Normal judgment/insight. Normal mood and affect. Oriented x3.   Current Meds  Medication Sig   aspirin 81 MG tablet Take 1 tablet (81 mg total) by mouth daily.   Chlorhexidine Gluconate 2 % SOLN For soaking infected finger. Immerse finger in solution for 10-15 mins, 4-5 times per day   denosumab (PROLIA) 60 MG/ML SOSY injection Inject 60 mg into the skin every 6 (six) months.   diclofenac sodium (VOLTAREN) 1 % GEL Apply 4 g topically 4 (four) times daily. Knee DJD.   hydrocortisone 2.5 % cream    levothyroxine (SYNTHROID) 25 MCG tablet TAKE 1 TABLET (25 MCG TOTAL) BY MOUTH DAILY BEFORE BREAKFAST.   meclizine (ANTIVERT) 25 MG tablet Take 1 tablet (25 mg total) by mouth 3 (three) times daily as needed for dizziness.   Multiple Vitamin (MULTI-VITAMIN DAILY PO) Take by mouth.   mupirocin ointment (BACTROBAN) 2 % Apply 1 application topically 4 (four) times daily. Apply to dry skin after chlorhexidine washes to affected area for 7-10 days.   rosuvastatin (CRESTOR) 10 MG tablet TAKE 1 TABLET BY MOUTH EVERY DAY   [DISCONTINUED] losartan (COZAAR) 100 MG tablet TAKE 1 TABLET BY MOUTH EVERY DAY    Allergies  Allergen Reactions   Tape Rash    Patient Active Problem List   Diagnosis Date Noted   Onychomycosis 12/28/2018   Ingrown fingernail 12/28/2018   Osteoporosis 01/13/2018   Right knee DJD 07/28/2017   Hypertension    Hypercholesteremia    Melanoma ([place])    Hypothyroid    Hiatal hernia     Family History  Problem Relation Age of Onset   Cancer Mother        Breast   Heart attack Mother    Cancer Sister        breast    Hypertension Father     Social History   Tobacco Use  Smoking Status Never  Smokeless Tobacco Never    Past Surgical History:  Procedure Laterality Date   CAPSULOTOMY Right 10/18/2020   right eye   PARATHYROIDECTOMY  06/22/2019   TONSILLECTOMY AND ADENOIDECTOMY     TUBAL LIGATION      Immunization History  Administered Date(s) Administered   Influenza-Unspecified 11/22/2017, 11/29/2018, 01/14/2020   Moderna Sars-Covid-2 Vaccination 07/06/2020   PFIZER(Purple Top)SARS-COV-2 Vaccination 05/05/2019, 05/26/2019, 12/24/2019   Pneumococcal Polysaccharide-23 12/29/2018   Tdap 07/30/2017    Recent Results (from the past 2160 hour(s))  POCT glucose (manual entry)     Status: Abnormal   Collection Time: 10/02/20 12:55 PM  Result Value Ref Range   POC Glucose 117 (A) 70 - 99 mg/dl  CBC     Status: Abnormal   Collection Time: 10/25/20 12:00 AM  Result Value Ref Range   WBC 4.6 3.8 - 10.8 Thousand/uL   RBC 3.90 3.80 - 5.10 Million/uL   Hemoglobin 11.5 (L) 11.7 -  15.5 g/dL   HCT 35.3 35.0 - 45.0 %   MCV 90.5 80.0 - 100.0 fL   MCH 29.5 27.0 - 33.0 pg   MCHC 32.6 32.0 - 36.0 g/dL   RDW 13.1 11.0 - 15.0 %   Platelets 273 140 - 400 Thousand/uL   MPV 11.5 7.5 - 12.5 fL  COMPLETE METABOLIC PANEL WITH GFR     Status: None   Collection Time: 10/25/20 12:00 AM  Result Value Ref Range   Glucose, Bld 88 65 - 99 mg/dL    Comment: .            Fasting reference interval .    BUN 20 7 - 25 mg/dL   Creat 0.84 0.60 - 0.95 mg/dL   eGFR 70 > OR = 60 mL/min/1.84m    Comment: The eGFR is based on the CKD-EPI 2021 equation. To calculate  the new eGFR from a previous Creatinine or Cystatin C result, go to https://www.kidney.org/professionals/ kdoqi/gfr%5Fcalculator    BUN/Creatinine Ratio NOT APPLICABLE 6 - 22 (calc)   Sodium 142 135 - 146 mmol/L   Potassium 4.2 3.5 - 5.3 mmol/L   Chloride 109 98 - 110 mmol/L   CO2 23 20 - 32 mmol/L   Calcium 9.7 8.6 - 10.4 mg/dL   Total Protein 6.9  6.1 - 8.1 g/dL   Albumin 4.4 3.6 - 5.1 g/dL   Globulin 2.5 1.9 - 3.7 g/dL (calc)   AG Ratio 1.8 1.0 - 2.5 (calc)   Total Bilirubin 0.7 0.2 - 1.2 mg/dL   Alkaline phosphatase (APISO) 82 37 - 153 U/L   AST 22 10 - 35 U/L   ALT 18 6 - 29 U/L  Lipid panel     Status: None   Collection Time: 10/25/20 12:00 AM  Result Value Ref Range   Cholesterol 135 <200 mg/dL   HDL 59 > OR = 50 mg/dL   Triglycerides 111 <150 mg/dL   LDL Cholesterol (Calc) 57 mg/dL (calc)    Comment: Reference range: <100 . Desirable range <100 mg/dL for primary prevention;   <70 mg/dL for patients with CHD or diabetic patients  with > or = 2 CHD risk factors. .Marland KitchenLDL-C is now calculated using the Martin-Hopkins  calculation, which is a validated novel method providing  better accuracy than the Friedewald equation in the  estimation of LDL-C.  MCresenciano Genreet al. JAnnamaria Helling [id number];998(33: 2061-2068  (http://education.[employer].com/faq/FAQ164)    Total CHOL/HDL Ratio 2.3 <5.0 (calc)   Non-HDL Cholesterol (Calc) 76 <130 mg/dL (calc)    Comment: For patients with diabetes plus 1 major ASCVD risk  factor, treating to a non-HDL-C goal of <100 mg/dL  (LDL-C of <70 mg/dL) is considered a therapeutic  option.   TSH     Status: None   Collection Time: 10/25/20 12:00 AM  Result Value Ref Range   TSH 2.83 0.40 - 4.50 mIU/L  Hemoglobin A1c     Status: None   Collection Time: 10/25/20 12:00 AM  Result Value Ref Range   Hgb A1c MFr Bld 5.6 <5.7 % of total Hgb    Comment: For the purpose of screening for the presence of diabetes: . <5.7%       Consistent with the absence of diabetes 5.7-6.4%    Consistent with increased risk for diabetes             (prediabetes) > or =6.5%  Consistent with diabetes . This assay result is consistent with a decreased risk of diabetes. .Marland Kitchen  Currently, no consensus exists regarding use of hemoglobin A1c for diagnosis of diabetes in children. . According to American Diabetes Association  (ADA) guidelines, hemoglobin A1c <7.0% represents optimal control in non-pregnant diabetic patients. Different metrics may apply to specific patient populations.  Standards of Medical Care in Diabetes(ADA). .    Mean Plasma Glucose 114 mg/dL   eAG (mmol/L) 6.3 mmol/L  Parathyroid hormone, intact (no Ca)     Status: Abnormal   Collection Time: 10/25/20 12:00 AM  Result Value Ref Range   PTH 118 (H) 16 - 77 pg/mL    Comment: . Interpretive Guide    Intact PTH           Calcium ------------------    ----------           ------- Normal Parathyroid    Normal               Normal Hypoparathyroidism    Low or Low Normal    Low Hyperparathyroidism    Primary            Normal or High       High    Secondary          High                 Normal or Low    Tertiary           High                 High Non-Parathyroid    Hypercalcemia      Low or Low Normal    High .   Calcium, ionized     Status: None   Collection Time: 10/25/20 12:00 AM  Result Value Ref Range   Calcium, Ion 5.21 4.8 - 5.6 mg/dL    No results found.     All questions at time of visit were answered - patient instructed to contact office with any additional concerns or updates. ER/RTC precautions were reviewed with the patient as applicable.   Please note: manual typing as well as voice recognition software may have been used to produce this document - typos may escape review. Please contact Dr. Sheppard Coil for any needed clarifications.

## 2020-10-25 NOTE — Patient Instructions (Addendum)
FYI to my patients: After six years here, I will be leaving practice at [facility]. My last day here will be 12/28/2020. I will continue to provide your care up until that date, and you will still be considered a patient here after that as long as you want to be!    After 12/28/2020, my patients have several options to continue care:  1) you can establish care with Dr. Cody Matthews or Joy Jessup NP, who are accepting new patients here and are absorbing many folks from my current patient panel, OR...  2) you can see any available provider here on as-needed basis until my official replacement starts (hiring a new doctor has not been finalized yet), OR... 3) if you choose to seek care elsewhere, this office will be happy to facilitate transfer of records, and will refill medications on a case-by-case basis.   It is bittersweet to leave! I will be practicing inpatient hospital medicine at [facility], continuing to serve as chair for [employer], and I will also be teaching medical learners. I have truly enjoyed taking care of folks here, but I am also excited for my next adventure doing something a bit different. Take care, and please let us know if you have any questions!   -Dr. A.     

## 2020-10-26 ENCOUNTER — Other Ambulatory Visit: Payer: Self-pay | Admitting: Osteopathic Medicine

## 2020-10-26 LAB — COMPLETE METABOLIC PANEL WITH GFR
AG Ratio: 1.8 (calc) (ref 1.0–2.5)
ALT: 18 U/L (ref 6–29)
AST: 22 U/L (ref 10–35)
Albumin: 4.4 g/dL (ref 3.6–5.1)
Alkaline phosphatase (APISO): 82 U/L (ref 37–153)
BUN: 20 mg/dL (ref 7–25)
CO2: 23 mmol/L (ref 20–32)
Calcium: 9.7 mg/dL (ref 8.6–10.4)
Chloride: 109 mmol/L (ref 98–110)
Creat: 0.84 mg/dL (ref 0.60–0.95)
Globulin: 2.5 g/dL (calc) (ref 1.9–3.7)
Glucose, Bld: 88 mg/dL (ref 65–99)
Potassium: 4.2 mmol/L (ref 3.5–5.3)
Sodium: 142 mmol/L (ref 135–146)
Total Bilirubin: 0.7 mg/dL (ref 0.2–1.2)
Total Protein: 6.9 g/dL (ref 6.1–8.1)
eGFR: 70 mL/min/{1.73_m2} (ref >=60)

## 2020-10-26 LAB — CBC
HCT: 35.3 % (ref 35.0–45.0)
Hemoglobin: 11.5 g/dL — ABNORMAL LOW (ref 11.7–15.5)
MCH: 29.5 pg (ref 27.0–33.0)
MCHC: 32.6 g/dL (ref 32.0–36.0)
MCV: 90.5 fL (ref 80.0–100.0)
MPV: 11.5 fL (ref 7.5–12.5)
Platelets: 273 10*3/uL (ref 140–400)
RBC: 3.9 10*6/uL (ref 3.80–5.10)
RDW: 13.1 % (ref 11.0–15.0)
WBC: 4.6 10*3/uL (ref 3.8–10.8)

## 2020-10-26 LAB — LIPID PANEL
Cholesterol: 135 mg/dL (ref <200)
HDL: 59 mg/dL (ref >=50)
LDL Cholesterol (Calc): 57 mg/dL (calc)
Non-HDL Cholesterol (Calc): 76 mg/dL (calc) (ref <130)
Total CHOL/HDL Ratio: 2.3 (calc) (ref <5.0)
Triglycerides: 111 mg/dL (ref <150)

## 2020-10-26 LAB — HEMOGLOBIN A1C
Hgb A1c MFr Bld: 5.6 % of total Hgb (ref <5.7)
Mean Plasma Glucose: 114 mg/dL
eAG (mmol/L): 6.3 mmol/L

## 2020-10-26 LAB — CALCIUM, IONIZED: Calcium, Ion: 5.21 mg/dL (ref 4.8–5.6)

## 2020-10-26 LAB — PARATHYROID HORMONE, INTACT (NO CA): PTH: 118 pg/mL — ABNORMAL HIGH (ref 16–77)

## 2020-10-26 LAB — TSH: TSH: 2.83 mIU/L (ref 0.40–4.50)

## 2020-11-09 ENCOUNTER — Telehealth: Payer: Self-pay

## 2020-11-09 NOTE — Telephone Encounter (Signed)
Patient left a vm msg requesting when she is due for her next prolia injection. Last labs completed on 10/25/20. Pls contact patient for a NV as needed.  Cyril Mourning - a prior authorization for injection may be required.  Thanks in advance.

## 2020-11-12 NOTE — Telephone Encounter (Signed)
denosumab (PROLIA) injection 60 mg 60 mg  Once 03/28/2019 03/28/2019  Admin Instructions: Please verify that patient has been taking calcium and vitamin D supplement and CMET within the last 12 months. Do NOT give if calcium low; contact MD and/or pharmacist to evaluate. Provide patient medication guide if has not received previously.  For OUTPATIENT use only. Administer in the upper arm, upper thigh, or abdomen.  Route: Subcutaneous  Non-formulary Exception Code: Acceptable to use formulary alternative

## 2020-11-12 NOTE — Telephone Encounter (Signed)
I am not able to find documentation in her chart of her last Prolia injection. Can you help me?

## 2020-11-12 NOTE — Telephone Encounter (Signed)
Pt's last Prolia injection was 03/28/2019 and the last office note does not mention anything about her restarting injections. There were orders added for a DXA. Do you want her to restart Prolia injections?

## 2020-11-13 NOTE — Telephone Encounter (Signed)
Apologies, no details in the note you are right.  I would say we need to get the bone density test done since it has been almost 2 years since her last Prolia injection.  Depending on bone density test results, may decide on Prolia versus something else.

## 2020-11-14 NOTE — Telephone Encounter (Signed)
Patient advised of recommendations. She will call to schedule the Dexa scan.

## 2020-11-14 NOTE — Telephone Encounter (Signed)
LVMTRC (1st attempt)   

## 2020-11-23 ENCOUNTER — Encounter: Payer: Self-pay | Admitting: Osteopathic Medicine

## 2020-12-05 ENCOUNTER — Other Ambulatory Visit: Payer: Medicare Other

## 2020-12-06 ENCOUNTER — Other Ambulatory Visit: Payer: Self-pay | Admitting: Osteopathic Medicine

## 2020-12-17 ENCOUNTER — Other Ambulatory Visit: Payer: Self-pay | Admitting: Osteopathic Medicine

## 2021-01-30 ENCOUNTER — Ambulatory Visit (INDEPENDENT_AMBULATORY_CARE_PROVIDER_SITE_OTHER): Payer: Medicare Other

## 2021-01-30 ENCOUNTER — Other Ambulatory Visit: Payer: Self-pay

## 2021-01-30 DIAGNOSIS — M810 Age-related osteoporosis without current pathological fracture: Secondary | ICD-10-CM | POA: Diagnosis not present

## 2021-01-30 DIAGNOSIS — Z1231 Encounter for screening mammogram for malignant neoplasm of breast: Secondary | ICD-10-CM

## 2021-01-31 NOTE — Progress Notes (Signed)
Patient would like to continue with Prolia after reviewing bone density test. Needs updated CMP and prior auth - okay to begin process. Will place order. She can come by the lab for blood work.  Purcell Nails Olevia Bowens, DNP, FNP-C

## 2021-01-31 NOTE — Addendum Note (Signed)
Addended by: Caleen Jobs B on: 01/31/2021 11:17 AM   Modules accepted: Orders

## 2021-02-06 ENCOUNTER — Telehealth: Payer: Self-pay

## 2021-02-06 NOTE — Telephone Encounter (Signed)
Medication: denosumab (PROLIA) 60 MG/ML SOSY injection  I spoke with Vicente Males at [facility] and according to the pts plan, a PA is not required for Prolia injections.  Pt aware via voicemail and advised her to come in to have the labs drawn and once we have those results back we can get her scheduled for the injection.

## 2021-02-07 NOTE — Progress Notes (Signed)
Please call patient. Normal mammogram.  Repeat in 1 year.  

## 2021-02-13 LAB — COMPLETE METABOLIC PANEL WITH GFR
AG Ratio: 1.6 (calc) (ref 1.0–2.5)
ALT: 16 U/L (ref 6–29)
AST: 22 U/L (ref 10–35)
Albumin: 4.4 g/dL (ref 3.6–5.1)
Alkaline phosphatase (APISO): 82 U/L (ref 37–153)
BUN: 16 mg/dL (ref 7–25)
CO2: 25 mmol/L (ref 20–32)
Calcium: 10.1 mg/dL (ref 8.6–10.4)
Chloride: 106 mmol/L (ref 98–110)
Creat: 0.83 mg/dL (ref 0.60–0.95)
Globulin: 2.8 g/dL (calc) (ref 1.9–3.7)
Glucose, Bld: 87 mg/dL (ref 65–99)
Potassium: 4.2 mmol/L (ref 3.5–5.3)
Sodium: 140 mmol/L (ref 135–146)
Total Bilirubin: 0.6 mg/dL (ref 0.2–1.2)
Total Protein: 7.2 g/dL (ref 6.1–8.1)
eGFR: 71 mL/min/{1.73_m2} (ref >=60)

## 2021-02-20 ENCOUNTER — Other Ambulatory Visit: Payer: Self-pay

## 2021-02-20 ENCOUNTER — Ambulatory Visit (INDEPENDENT_AMBULATORY_CARE_PROVIDER_SITE_OTHER): Payer: Medicare Other | Admitting: Family Medicine

## 2021-02-20 VITALS — BP 140/82 | HR 86

## 2021-02-20 DIAGNOSIS — M810 Age-related osteoporosis without current pathological fracture: Secondary | ICD-10-CM

## 2021-02-20 MED ORDER — DENOSUMAB 60 MG/ML ~~LOC~~ SOSY
60.0000 mg | PREFILLED_SYRINGE | Freq: Once | SUBCUTANEOUS | Status: AC
  Administered 2021-02-20: 60 mg via SUBCUTANEOUS

## 2021-02-20 NOTE — Progress Notes (Signed)
Medical screening examination/treatment was performed by qualified clinical staff member and as supervising physician I was immediately available for consultation/collaboration. I have reviewed documentation and agree with assessment and plan.  Dawsyn Ramsaran, DO  

## 2021-02-20 NOTE — Progress Notes (Signed)
Established Patient Office Visit  Subjective:  Patient ID: Jennifer Ray, female    DOB: 20-Mar-1941  Age: 80 y.o. MRN: [id number]  CC:  Chief Complaint  Patient presents with   Osteoporosis    HPI Jennifer Ray presents for Prolia injection.   Past Medical History:  Diagnosis Date   Hiatal hernia    Hypercholesteremia    Hypertension    Hypothyroid    Melanoma ([place])    removed    Past Surgical History:  Procedure Laterality Date   CAPSULOTOMY Right 10/18/2020   right eye   PARATHYROIDECTOMY  06/22/2019   TONSILLECTOMY AND ADENOIDECTOMY     TUBAL LIGATION      Family History  Problem Relation Age of Onset   Cancer Mother        Breast   Heart attack Mother    Cancer Sister        breast   Hypertension Father     Social History   Socioeconomic History   Marital status: Divorced    Spouse name: Not on file   Number of children: 2   Years of education: 16   Highest education level: 12th grade  Occupational History   Occupation: retired    Comment: Catering manager  Tobacco Use   Smoking status: Never   Smokeless tobacco: Never  Scientific laboratory technician Use: Never used  Substance and Sexual Activity   Alcohol use: No   Drug use: No   Sexual activity: Not Currently  Other Topics Concern   Not on file  Social History Narrative   Patient walks everyday nad does stairs everyday. Eats very healthy a lot of vegetables.   Social Determinants of Health   Financial Resource Strain: Low Risk    Difficulty of Paying Living Expenses: Not hard at all  Food Insecurity: No Food Insecurity   Worried About Charity fundraiser in the Last Year: Never true   [place] in the Last Year: Never true  Transportation Needs: No Transportation Needs   Lack of Transportation (Medical): No   Lack of Transportation (Non-Medical): No  Physical Activity: Sufficiently Active   Days of Exercise per Week: 7 days   Minutes of Exercise per Session: 30 min   Stress: No Stress Concern Present   Feeling of Stress : Not at all  Social Connections: Moderately Isolated   Frequency of Communication with Friends and Family: More than three times a week   Frequency of Social Gatherings with Friends and Family: Once a week   Attends Religious Services: Never   Marine scientist or Organizations: Yes   Attends Music therapist: More than 4 times per year   Marital Status: Divorced  Human resources officer Violence: Not At Risk   Fear of Current or Ex-Partner: No   Emotionally Abused: No   Physically Abused: No   Sexually Abused: No    Outpatient Medications Prior to Visit  Medication Sig Dispense Refill   aspirin 81 MG tablet Take 1 tablet (81 mg total) by mouth daily. 90 tablet 3   Chlorhexidine Gluconate 2 % SOLN For soaking infected finger. Immerse finger in solution for 10-15 mins, 4-5 times per day 250 mL 1   denosumab (PROLIA) 60 MG/ML SOSY injection Inject 60 mg into the skin every 6 (six) months.     diclofenac sodium (VOLTAREN) 1 % GEL Apply 4 g topically 4 (four) times daily. Knee DJD. 100 g 11  hydrocortisone 2.5 % cream      levothyroxine (SYNTHROID) 25 MCG tablet TAKE 1 TABLET BY MOUTH DAILY BEFORE BREAKFAST. 90 tablet 1   losartan (COZAAR) 100 MG tablet TAKE 1 TABLET BY MOUTH EVERY DAY 90 tablet 3   meclizine (ANTIVERT) 25 MG tablet Take 1 tablet (25 mg total) by mouth 3 (three) times daily as needed for dizziness. 30 tablet 0   Multiple Vitamin (MULTI-VITAMIN DAILY PO) Take by mouth.     mupirocin ointment (BACTROBAN) 2 % Apply 1 application topically 4 (four) times daily. Apply to dry skin after chlorhexidine washes to affected area for 7-10 days. 30 g 3   rosuvastatin (CRESTOR) 10 MG tablet TAKE 1 TABLET BY MOUTH EVERY DAY 90 tablet 0   No facility-administered medications prior to visit.    Allergies  Allergen Reactions   Tape Rash    ROS Review of Systems    Objective:    Physical Exam  BP 140/82    Pulse 86   SpO2 98%  Wt Readings from Last 3 Encounters:  10/25/20 157 lb (71.2 kg)  10/02/20 159 lb 3.2 oz (72.2 kg)  08/17/19 168 lb (76.2 kg)     Health Maintenance Due  Topic Date Due   Zoster Vaccines- Shingrix (1 of 2) Never done   Pneumonia Vaccine 65+ Years old (2 - PCV) 12/29/2019    There are no preventive care reminders to display for this patient.  Lab Results  Component Value Date   TSH 2.83 10/25/2020   Lab Results  Component Value Date   WBC 4.6 10/25/2020   HGB 11.5 (L) 10/25/2020   HCT 35.3 10/25/2020   MCV 90.5 10/25/2020   PLT 273 10/25/2020   Lab Results  Component Value Date   NA 140 02/13/2021   K 4.2 02/13/2021   CO2 25 02/13/2021   GLUCOSE 87 02/13/2021   BUN 16 02/13/2021   CREATININE 0.83 02/13/2021   BILITOT 0.6 02/13/2021   AST 22 02/13/2021   ALT 16 02/13/2021   PROT 7.2 02/13/2021   CALCIUM 10.1 02/13/2021   EGFR 71 02/13/2021   Lab Results  Component Value Date   CHOL 135 10/25/2020   Lab Results  Component Value Date   HDL 59 10/25/2020   Lab Results  Component Value Date   LDLCALC 57 10/25/2020   Lab Results  Component Value Date   TRIG 111 10/25/2020   Lab Results  Component Value Date   CHOLHDL 2.3 10/25/2020   Lab Results  Component Value Date   HGBA1C 5.6 10/25/2020      Assessment & Plan:  Osteoporosis - Patient tolerated injection well without complications. Patient advised to schedule next injection 6 months from today.    Problem List Items Addressed This Visit     Osteoporosis - Primary    Meds ordered this encounter  Medications   denosumab (PROLIA) injection 60 mg    Order Specific Question:   Patient is enrolled in [facility] program for this medication and I have provided a copy of the Prolia Medication Guide and Patient Brochure.    Answer:   No    Order Specific Question:   I have reviewed with the patient the information in the Prolia Medication Guide and Patient Counseling Chart including  the serious risks of Prolia and symptoms of each risk.    Answer:   Yes    Order Specific Question:   I have advised the patient to seek medical attention if they   have signs or symptoms of any of the serious risks.    Answer:   Yes    Follow-up: Return in about 6 months (around 08/20/2021) for prolia injection. .    Tuttle, Angela Hale, CMA 

## 2021-02-23 ENCOUNTER — Other Ambulatory Visit: Payer: Self-pay | Admitting: Osteopathic Medicine

## 2021-04-15 ENCOUNTER — Ambulatory Visit (INDEPENDENT_AMBULATORY_CARE_PROVIDER_SITE_OTHER): Payer: Medicare Other | Admitting: Pharmacist

## 2021-04-15 ENCOUNTER — Other Ambulatory Visit: Payer: Self-pay

## 2021-04-15 DIAGNOSIS — E7800 Pure hypercholesterolemia, unspecified: Secondary | ICD-10-CM

## 2021-04-15 DIAGNOSIS — I10 Essential (primary) hypertension: Secondary | ICD-10-CM

## 2021-04-15 NOTE — Progress Notes (Signed)
Chronic Care Management Pharmacy Note  04/16/2021 Name:  LEATTA ALEWINE MRN:  [id number] DOB:  [birth year]  Summary: addressed HTN, HLD. Reviewed medications, patient is doing well, no concerns.  Recommendations/Changes made from today's visit: none  Plan:  f/u with pharmacist in 1 year  Subjective: [name] P Montemurro is an 81 y.o. year old female who is a primary patient of Emeterio Reeve, DO.  The CCM team was consulted for assistance with disease management and care coordination needs.    Engaged with patient by telephone for follow up visit in response to provider referral for pharmacy case management and/or care coordination services.   Consent to Services:  The patient was given information about Chronic Care Management services, agreed to services, and gave verbal consent prior to initiation of services.  Please see initial visit note for detailed documentation.   Patient Care Team: Emeterio Reeve, DO as PCP - General (Osteopathic Medicine) Darius Bump, [facility] as Pharmacist (Pharmacist)  Recent office visits:  10/02/20 Caleen Jobs, NP Seen for vertigo due to traumatic head injury. Patient declined physical therapy.Start Meclizine 25 mg TID. Hand out provided for Epley Maneuver.CT scan of head and L wrist xray ordered. Follow up as needed.   07/25/20 Samuel Bouche, NP (Video Visit)Seen for a hospital follow up due to cat scratch on back of right hand. Follow up as needed.   Recent consult visits:  None noted   Hospital visits:  Medication Reconciliation was completed by comparing discharge summary, patients EMR and Pharmacy list, and upon discussion with patient.   Admitted to the hospital on 07/19/20 due to cat scratch on right hand. Discharge date was 07/19/20. Discharged from [place]?Medications Started at [facility] Discharge:?? -started Augmentin 875-125 mg due to cat scratch   Medication Changes at Hospital Discharge: -Changed none    Medications Discontinued at Hospital Discharge: -Stopped none   Medications that remain the same after Hospital Discharge:?? -All other medications will remain the same.    Objective:  Lab Results  Component Value Date   CREATININE 0.83 02/13/2021   CREATININE 0.84 10/25/2020   CREATININE 0.86 08/17/2019    Lab Results  Component Value Date   HGBA1C 5.6 10/25/2020   Last diabetic Eye exam: No results found for: HMDIABEYEEXA  Last diabetic Foot exam: No results found for: HMDIABFOOTEX      Component Value Date/Time   CHOL 135 10/25/2020 0000   TRIG 111 10/25/2020 0000   HDL 59 10/25/2020 0000   CHOLHDL 2.3 10/25/2020 0000   LDLCALC 57 10/25/2020 0000    Hepatic Function Latest Ref Rng & Units 02/13/2021 10/25/2020 08/17/2019  Total Protein 6.1 - 8.1 g/dL 7.2 6.9 7.1  AST 10 - 35 U/L 22 22 18   ALT 6 - 29 U/L 16 18 12   Total Bilirubin 0.2 - 1.2 mg/dL 0.6 0.7 0.7    Lab Results  Component Value Date/Time   TSH 2.83 10/25/2020 12:00 AM   TSH 2.85 08/17/2019 10:48 AM   FREET4 1.1 08/17/2019 10:48 AM    CBC Latest Ref Rng & Units 10/25/2020 08/17/2019 09/01/2018  WBC 3.8 - 10.8 Thousand/uL 4.6 5.8 5.1  Hemoglobin 11.7 - 15.5 g/dL 11.5(L) 14.1 13.9  Hematocrit 35.0 - 45.0 % 35.3 41.9 41.4  Platelets 140 - 400 Thousand/uL 273 252 232    Social History   Tobacco Use  Smoking Status Never  Smokeless Tobacco Never   BP Readings from Last 3 Encounters:  02/20/21 140/82  10/25/20 Marland Kitchen)  147/81  10/02/20 (!) 149/91   Pulse Readings from Last 3 Encounters:  02/20/21 86  10/25/20 76  10/02/20 (!) 105   Wt Readings from Last 3 Encounters:  10/25/20 157 lb (71.2 kg)  10/02/20 159 lb 3.2 oz (72.2 kg)  08/17/19 168 lb (76.2 kg)    Assessment: Review of patient past medical history, allergies, medications, health status, including review of consultants reports, laboratory and other test data, was performed as part of comprehensive evaluation and provision of chronic care  management services.   SDOH:  (Social Determinants of Health) assessments and interventions performed:    CCM Care Plan  Allergies  Allergen Reactions   Tape Rash    Medications Reviewed Today     Reviewed by Darius Bump, [facility] (Pharmacist) on 04/15/21 at 1418  Med List Status: <None>   Medication Order Taking? Sig Documenting Provider Last Dose Status Informant  aspirin 81 MG tablet [id number] Yes Take 1 tablet (81 mg total) by mouth daily. Emeterio Reeve, DO Taking Active   b complex vitamins capsule [id number] Yes Take 2 capsules by mouth daily. [provider] Taking Active   Chlorhexidine Gluconate 2 % SOLN [id number] Yes For soaking infected finger. Immerse finger in solution for 10-15 mins, 4-5 times per day Gregor Hams, MD Taking Active   denosumab (PROLIA) 60 MG/ML SOSY injection [id number] Yes Inject 60 mg into the skin every 6 (six) months. [provider] Taking Active   diclofenac sodium (VOLTAREN) 1 % GEL [id number] Yes Apply 4 g topically 4 (four) times daily. Knee DJD. Gregor Hams, MD Taking Active   hydrocortisone 2.5 % cream [id number] Yes  [provider] Taking Active   levothyroxine (SYNTHROID) 25 MCG tablet [id number]  TAKE 1 TABLET BY MOUTH DAILY BEFORE BREAKFAST. Emeterio Reeve, DO  Active   losartan (COZAAR) 100 MG tablet [id number]  TAKE 1 TABLET BY MOUTH EVERY DAY Emeterio Reeve, DO  Active   meclizine (ANTIVERT) 25 MG tablet [id number]  Take 1 tablet (25 mg total) by mouth 3 (three) times daily as needed for dizziness. Terrilyn Saver, NP  Active   Multiple Vitamin (MULTI-VITAMIN DAILY PO) [id number]  Take by mouth. [provider]  Active   mupirocin ointment (BACTROBAN) 2 % [id number]  Apply 1 application topically 4 (four) times daily. Apply to dry skin after chlorhexidine washes to affected area for 7-10 days. Emeterio Reeve, DO  Active   rosuvastatin (CRESTOR) 10 MG tablet [id number]  TAKE 1 TABLET BY MOUTH  EVERY DAY Hali Marry, MD  Active             Patient Active Problem List   Diagnosis Date Noted   Onychomycosis 12/28/2018   Ingrown fingernail 12/28/2018   Osteoporosis 01/13/2018   Right knee DJD 07/28/2017   Hypertension    Hypercholesteremia    Melanoma ([place])    Hypothyroid    Hiatal hernia     Immunization History  Administered Date(s) Administered   Influenza-Unspecified 11/22/2017, 11/29/2018, 01/14/2020, 11/30/2020   Moderna Sars-Covid-2 Vaccination 07/06/2020, 01/01/2021   PFIZER(Purple Top)SARS-COV-2 Vaccination 05/05/2019, 05/26/2019, 12/24/2019   Pneumococcal Polysaccharide-23 12/29/2018   Tdap 07/30/2017    Conditions to be addressed/monitored: HTN and HLD  Care Plan : Medication Management  Updates made by Darius Bump, [place] since 04/16/2021 12:00 AM     Problem: HTN, HLD      Long-Range Goal: Disease Progression Prevention   Start Date: 10/08/2020  Recent Progress: On track  Priority: High  Note:   Current Barriers:  None at present  Pharmacist Clinical Goal(s):  Over the next 365 days, patient will maintain control of chronic conditions as evidenced by medication fill history, lab values, and vital signs  through collaboration with PharmD and provider.   Interventions: 1:1 collaboration with Emeterio Reeve, DO regarding development and update of comprehensive plan of care as evidenced by provider attestation and co-signature Inter-disciplinary care team collaboration (see longitudinal plan of care) Comprehensive medication review performed; medication list updated in electronic medical record  Hypertension:  Controlled; current treatment:losartan 100mg  daily;   Current home readings: not currently checking, but does have cuff at home for PRN use  Denies hypotensive/hypertensive symptoms  Recommended continue current regimen and  Hyperlipidemia:  Controlled; current treatment:rosuvastatin 10mg  daily;   Recommended continue  current regimen  Patient Goals/Self-Care Activities Over the next 365 days, patient will:  take medications as prescribed  Follow Up Plan: Telephone follow up appointment with care management team member scheduled for:  1 year      Medication Assistance: None required.  Patient affirms current coverage meets needs.  Patient's preferred pharmacy is:  [id number] - [place], [place] [address] [place] [address] Phone: [phone] Fax: [place], [place] [address] [place] [address] Phone: [phone] Fax: [phone]   Uses pill box? No - keep in bottles in certain locations of house, works well for her Pt endorses 95% compliance  Follow Up:  Patient agrees to Care Plan and Follow-up.  Plan: Telephone follow up appointment with care management team member scheduled for:  6 months  Larinda Buttery, PharmD Clinical Pharmacist [facility] [phone]

## 2021-04-16 NOTE — Patient Instructions (Signed)
Visit Information  Thank you for taking time to visit with me today. Please don't hesitate to contact me if I can be of assistance to you before our next scheduled telephone appointment.  Following are the goals we discussed today:  Patient Goals/Self-Care Activities Over the next 365 days, patient will:  take medications as prescribed  Follow Up Plan: Telephone follow up appointment with care management team member scheduled for:  1 year  Please call the care guide team at [phone] if you need to cancel or reschedule your appointment.    Patient verbalizes understanding of instructions and care plan provided today and agrees to view in [place]. Active MyChart status confirmed with patient.    Jennifer Ray

## 2021-04-29 ENCOUNTER — Ambulatory Visit (INDEPENDENT_AMBULATORY_CARE_PROVIDER_SITE_OTHER): Payer: Medicare Other | Admitting: Medical-Surgical

## 2021-04-29 ENCOUNTER — Encounter: Payer: Self-pay | Admitting: Medical-Surgical

## 2021-04-29 ENCOUNTER — Other Ambulatory Visit: Payer: Self-pay

## 2021-04-29 VITALS — BP 133/77 | HR 95 | Resp 20 | Ht 64.0 in | Wt 157.0 lb

## 2021-04-29 DIAGNOSIS — M810 Age-related osteoporosis without current pathological fracture: Secondary | ICD-10-CM | POA: Diagnosis not present

## 2021-04-29 DIAGNOSIS — R200 Anesthesia of skin: Secondary | ICD-10-CM

## 2021-04-29 DIAGNOSIS — R202 Paresthesia of skin: Secondary | ICD-10-CM

## 2021-04-29 DIAGNOSIS — E7800 Pure hypercholesterolemia, unspecified: Secondary | ICD-10-CM | POA: Diagnosis not present

## 2021-04-29 DIAGNOSIS — I10 Essential (primary) hypertension: Secondary | ICD-10-CM

## 2021-04-29 DIAGNOSIS — E892 Postprocedural hypoparathyroidism: Secondary | ICD-10-CM

## 2021-04-29 DIAGNOSIS — E039 Hypothyroidism, unspecified: Secondary | ICD-10-CM | POA: Diagnosis not present

## 2021-04-29 DIAGNOSIS — E213 Hyperparathyroidism, unspecified: Secondary | ICD-10-CM

## 2021-04-29 DIAGNOSIS — Z8639 Personal history of other endocrine, nutritional and metabolic disease: Secondary | ICD-10-CM

## 2021-04-29 DIAGNOSIS — M1991 Primary osteoarthritis, unspecified site: Secondary | ICD-10-CM

## 2021-04-29 MED ORDER — DICLOFENAC SODIUM 1 % EX GEL: 4.0000 g | Freq: Four times a day (QID) | CUTANEOUS | 11 refills | Status: AC

## 2021-04-29 NOTE — Progress Notes (Signed)
°  HPI with pertinent ROS:   CC: Chronic disease follow-up, transfer of care  HPI: Pleasant 81 year old female presenting today for transfer of care and follow-up:  Osteoporosis-every 61-month Prolia injections, tolerating well without side effects.  Arthritis-affecting her right hip, knee, and left shoulder.  Taking Tylenol arthritis as needed but this is not helpful.  Would like a prescription for Voltaren gel as needed.  Hypothyroidism-taking levothyroxine 25 mcg daily as prescribed, tolerating well without side effects.  Originally tried diet control however decided that the medication was the route to go.  Hypertension-not checking blood pressures at home.  Taking losartan 100 mg daily, tolerating well without side effects. Denies CP, SOB, palpitations, lower extremity edema, dizziness, headaches, or vision changes.  Hyperlipidemia-taking Crestor 10 mg nightly, tolerating well without side effects.  Following a low-fat diet.  Foot tingling-has been experiencing intermittent foot tingling for quite a while.  Mentioned this to her prior PCP but did not feel this was of concern.  I reviewed the past medical history, family history, social history, surgical history, and allergies today and no changes were needed.  Please see the problem list section below in epic for further details.  Physical exam:   General: Well Developed, well nourished, and in no acute distress.  Neuro: Alert and oriented x3.  HEENT: Normocephalic, atraumatic.  Skin: Warm and dry. Cardiac: Regular rate and rhythm, no murmurs rubs or gallops, no lower extremity edema.  Respiratory: Clear to auscultation bilaterally. Not using accessory muscles, speaking in full sentences.  Impression and Recommendations:    1. Primary hypertension Checking labs as below.  Blood pressure at goal today.  Continue losartan 100 mg daily. - CBC with Differential - COMPLETE METABOLIC PANEL WITH GFR - Lipid panel  2. Hypothyroidism,  unspecified type Checking TSH today.  Continue levothyroxine 25 mcg daily. - TSH - Parathyroid hormone, intact (no Ca)  3. Age-related osteoporosis without current pathological fracture Checking PTH.  Continue Prolia every 6 months - Parathyroid hormone, intact (no Ca)  4. Hypercholesteremia Checking lipid panel.  Continue Crestor 10 mg nightly. - Lipid panel  5. Numbness and tingling of foot Unclear etiology.  Checking labs today.  Consider possible lumbar spine etiology, venous insufficiency, or poor fitting footwear.  6. History of hyperparathyroidism Checking PTH today.  7. Primary osteoarthritis, unspecified site Voltaren gel 4 g 4 times daily as needed to affected areas.  Return in about 6 months (around 10/27/2021) for chronic disease follow up. ___________________________________________ Clearnce Sorrel, DNP, APRN, FNP-BC Primary Care and [place]

## 2021-04-30 DIAGNOSIS — I10 Essential (primary) hypertension: Secondary | ICD-10-CM

## 2021-04-30 DIAGNOSIS — E785 Hyperlipidemia, unspecified: Secondary | ICD-10-CM | POA: Diagnosis not present

## 2021-04-30 LAB — CBC WITH DIFFERENTIAL/PLATELET
Absolute Monocytes: 374 cells/uL (ref 200–950)
Basophils Absolute: 50 cells/uL (ref 0–200)
Basophils Relative: 0.9 %
Eosinophils Absolute: 171 cells/uL (ref 15–500)
Eosinophils Relative: 3.1 %
HCT: 34.9 % — ABNORMAL LOW (ref 35.0–45.0)
Hemoglobin: 11.1 g/dL — ABNORMAL LOW (ref 11.7–15.5)
Lymphs Abs: 1535 cells/uL (ref 850–3900)
MCH: 28.3 pg (ref 27.0–33.0)
MCHC: 31.8 g/dL — ABNORMAL LOW (ref 32.0–36.0)
MCV: 89 fL (ref 80.0–100.0)
MPV: 11.7 fL (ref 7.5–12.5)
Monocytes Relative: 6.8 %
Neutro Abs: 3372 cells/uL (ref 1500–7800)
Neutrophils Relative %: 61.3 %
Platelets: 320 10*3/uL (ref 140–400)
RBC: 3.92 10*6/uL (ref 3.80–5.10)
RDW: 13.3 % (ref 11.0–15.0)
Total Lymphocyte: 27.9 %
WBC: 5.5 10*3/uL (ref 3.8–10.8)

## 2021-04-30 LAB — COMPLETE METABOLIC PANEL WITH GFR
AG Ratio: 1.8 (calc) (ref 1.0–2.5)
ALT: 17 U/L (ref 6–29)
AST: 26 U/L (ref 10–35)
Albumin: 4.5 g/dL (ref 3.6–5.1)
Alkaline phosphatase (APISO): 45 U/L (ref 37–153)
BUN: 20 mg/dL (ref 7–25)
CO2: 21 mmol/L (ref 20–32)
Calcium: 8.9 mg/dL (ref 8.6–10.4)
Chloride: 107 mmol/L (ref 98–110)
Creat: 0.92 mg/dL (ref 0.60–0.95)
Globulin: 2.5 g/dL (calc) (ref 1.9–3.7)
Glucose, Bld: 78 mg/dL (ref 65–99)
Potassium: 4.7 mmol/L (ref 3.5–5.3)
Sodium: 140 mmol/L (ref 135–146)
Total Bilirubin: 0.7 mg/dL (ref 0.2–1.2)
Total Protein: 7 g/dL (ref 6.1–8.1)
eGFR: 63 mL/min/{1.73_m2} (ref >=60)

## 2021-04-30 LAB — LIPID PANEL
Cholesterol: 130 mg/dL (ref <200)
HDL: 67 mg/dL (ref >=50)
LDL Cholesterol (Calc): 46 mg/dL (calc)
Non-HDL Cholesterol (Calc): 63 mg/dL (calc) (ref <130)
Total CHOL/HDL Ratio: 1.9 (calc) (ref <5.0)
Triglycerides: 91 mg/dL (ref <150)

## 2021-04-30 LAB — TSH: TSH: 2.18 mIU/L (ref 0.40–4.50)

## 2021-04-30 LAB — PARATHYROID HORMONE, INTACT (NO CA): PTH: 166 pg/mL — ABNORMAL HIGH (ref 16–77)

## 2021-05-01 NOTE — Addendum Note (Signed)
Addended bySamuel Bouche on: 05/01/2021 07:17 AM   Modules accepted: Orders

## 2021-05-06 ENCOUNTER — Telehealth: Payer: Self-pay

## 2021-05-06 NOTE — Telephone Encounter (Signed)
[name] has seen an Musician in the past. She would like to be referred back to him.   Gerome Apley, MD [phone] (Work) [phone] (Fax) [address] [place], [place] [address] Endocrinology [facility] [place], [place] [address]

## 2021-05-07 ENCOUNTER — Ambulatory Visit (INDEPENDENT_AMBULATORY_CARE_PROVIDER_SITE_OTHER): Payer: Medicare Other

## 2021-05-07 ENCOUNTER — Ambulatory Visit (INDEPENDENT_AMBULATORY_CARE_PROVIDER_SITE_OTHER): Payer: Medicare Other | Admitting: Medical-Surgical

## 2021-05-07 ENCOUNTER — Other Ambulatory Visit: Payer: Self-pay

## 2021-05-07 ENCOUNTER — Telehealth: Payer: Self-pay

## 2021-05-07 ENCOUNTER — Encounter: Payer: Self-pay | Admitting: Medical-Surgical

## 2021-05-07 VITALS — BP 153/89 | HR 103 | Resp 20 | Wt 156.8 lb

## 2021-05-07 DIAGNOSIS — M25512 Pain in left shoulder: Secondary | ICD-10-CM

## 2021-05-07 DIAGNOSIS — M25551 Pain in right hip: Secondary | ICD-10-CM

## 2021-05-07 DIAGNOSIS — M25559 Pain in unspecified hip: Secondary | ICD-10-CM

## 2021-05-07 NOTE — Telephone Encounter (Signed)
Patient scheduled for a telehealth appointment this afternoon.

## 2021-05-07 NOTE — Telephone Encounter (Signed)
Patient called complaining about right hip and left shoulder pain. It has been going on for a few days and she unsure if it is from her arthritis or a side affect of her prolia shot. She would to have both sites x-rayed to be sure. Is ok to place orders?

## 2021-05-07 NOTE — Progress Notes (Signed)
°  HPI with pertinent ROS:   CC: Left shoulder/right hip pain  HPI: Very pleasant 81 year old female presenting today for evaluation of left shoulder and upper arm pain as well as right hip pain.  Left shoulder/arm-has had pain that started after her fall in our office.  Notes that her head and her left wrist were imaged but her left shoulder was not.  Has difficulty moving her arm normally and has increased pain with lifting and raising her arm over her head.  Would like to have this x-rayed today.  Right hip pain-started about 2 weeks ago and leads from her lower back around into the hip joint into the groin.  Areas involved are tender to touch.  She does not have any known injuries outside of her recent fall.  I reviewed the past medical history, family history, social history, surgical history, and allergies today and no changes were needed.  Please see the problem list section below in epic for further details.   Physical exam:   General: Well Developed, well nourished, and in no acute distress.  Neuro: Alert and oriented x3.  HEENT: Normocephalic, atraumatic.  Skin: Warm and dry. Cardiac: Regular rate and rhythm.  Respiratory: Not using accessory muscles, speaking in full sentences. MSK: Limited range of motion of left shoulder for flexion, extension, abduction, adduction.  Muscle strength limited by pain in the left upper extremity.  Tenderness to the right paraspinal muscles extending into the right hip and around to the right groin anteriorly.  Gait normal with normal weightbearing.  Impression and Recommendations:    1. Hip pain Suspect there is significant osteoarthritis to the right hip in conjunction with lumbar spine degeneration.  Getting x-rays of the right hip today for further evaluation.  Continue Voltaren gel topically and oral acetaminophen as needed for discomfort. - DG Hip Unilat W OR W/O Pelvis 2-3 Views Right; Future  2. Left shoulder pain, unspecified  chronicity Getting x-rays of the left shoulder for further evaluation.  Concern for possible occult fracture in the setting of known osteoporosis.  Pain control as above. - DG Shoulder Left; Future  Return if symptoms worsen or fail to improve. ___________________________________________ Clearnce Sorrel, DNP, APRN, FNP-BC Primary Care and [place]

## 2021-05-08 ENCOUNTER — Telehealth: Payer: Self-pay

## 2021-05-08 NOTE — Telephone Encounter (Signed)
Patient called in and would like to know how much calcium and vitamin D she should start taking?

## 2021-05-08 NOTE — Telephone Encounter (Signed)
Called and left message for patient to return call regarding answer to her question.

## 2021-05-13 ENCOUNTER — Other Ambulatory Visit: Payer: Self-pay | Admitting: Medical-Surgical

## 2021-05-16 ENCOUNTER — Other Ambulatory Visit: Payer: Self-pay | Admitting: Family Medicine

## 2021-05-19 ENCOUNTER — Other Ambulatory Visit: Payer: Self-pay | Admitting: Medical-Surgical

## 2021-05-19 MED ORDER — ROSUVASTATIN CALCIUM 10 MG PO TABS: 10.0000 mg | ORAL_TABLET | Freq: Every day | ORAL | 3 refills | Status: DC

## 2021-06-27 ENCOUNTER — Telehealth: Payer: Self-pay

## 2021-06-27 DIAGNOSIS — M810 Age-related osteoporosis without current pathological fracture: Secondary | ICD-10-CM

## 2021-06-27 NOTE — Telephone Encounter (Signed)
V.Mail left for patient to return call or stop by office (also stated card could be emailed). ?

## 2021-06-27 NOTE — Telephone Encounter (Signed)
It is time to PA patient's Prolia. I need to make sure patient still has BCBS as her secondary insurance and get a copy of the new card. Please contact patient.  ?

## 2021-08-15 NOTE — Addendum Note (Signed)
Addended by: Dema Severin on: 08/15/2021 03:20 PM   Modules accepted: Orders

## 2021-08-15 NOTE — Telephone Encounter (Signed)
PRIMARY: Benefits are subject to $226 deductible ($226 met) and 20% coinsurance for the administration and cost of prolia. SECONDARY: Plan is a Medicare plan F and it covers the medicare part B deductible, coinsurance and 100% of the excess charges.   Please call patient to get her schedule for labs and prolia after 08/20/21.   Lab order in.

## 2021-08-15 NOTE — Telephone Encounter (Signed)
LVM letting patient know lab orders are in & to give [place] a call back or schedule prolia when she comes in for lab work. AMUCK

## 2021-09-02 ENCOUNTER — Other Ambulatory Visit: Payer: Self-pay | Admitting: Medical-Surgical

## 2021-10-05 LAB — COMPLETE METABOLIC PANEL WITH GFR
AG Ratio: 1.7 (calc) (ref 1.0–2.5)
ALT: 19 U/L (ref 6–29)
AST: 22 U/L (ref 10–35)
Albumin: 4.3 g/dL (ref 3.6–5.1)
Alkaline phosphatase (APISO): 52 U/L (ref 37–153)
BUN: 18 mg/dL (ref 7–25)
CO2: 28 mmol/L (ref 20–32)
Calcium: 10.2 mg/dL (ref 8.6–10.4)
Chloride: 108 mmol/L (ref 98–110)
Creat: 0.77 mg/dL (ref 0.60–0.95)
Globulin: 2.5 g/dL (calc) (ref 1.9–3.7)
Glucose, Bld: 90 mg/dL (ref 65–99)
Potassium: 4.3 mmol/L (ref 3.5–5.3)
Sodium: 142 mmol/L (ref 135–146)
Total Bilirubin: 0.7 mg/dL (ref 0.2–1.2)
Total Protein: 6.8 g/dL (ref 6.1–8.1)
eGFR: 77 mL/min/{1.73_m2} (ref >=60)

## 2021-10-14 ENCOUNTER — Other Ambulatory Visit: Payer: Self-pay | Admitting: Osteopathic Medicine

## 2021-10-27 NOTE — Progress Notes (Unsigned)
   Established Patient Office Visit  Subjective   Patient ID: Jennifer Ray, female   DOB: 1940/04/07 Age: 81 y.o. MRN: [id number]   No chief complaint on file.   HPI Pleasant 81 year old female presenting today for the following:  HTN:  HLD:  Hypothyroidism:  Osteoporosis:      Objective:    There were no vitals filed for this visit.  Physical Exam   No results found for this or any previous visit (from the past 24 hour(s)).   {Labs (Optional):23779}  The ASCVD Risk score (Arnett DK, et al., 2019) failed to calculate for the following reasons:   The 2019 ASCVD risk score is only valid for ages 63 to 24   Assessment & Plan:   No problem-specific Assessment & Plan notes found for this encounter.   No follow-ups on file.  ___________________________________________ Clearnce Sorrel, DNP, APRN, FNP-BC Primary Care and [place]

## 2021-10-28 ENCOUNTER — Encounter: Payer: Self-pay | Admitting: Medical-Surgical

## 2021-10-28 ENCOUNTER — Ambulatory Visit (INDEPENDENT_AMBULATORY_CARE_PROVIDER_SITE_OTHER): Payer: Medicare Other | Admitting: Medical-Surgical

## 2021-10-28 VITALS — BP 131/84 | HR 79 | Temp 99.2°F | Ht 64.0 in | Wt 148.1 lb

## 2021-10-28 DIAGNOSIS — M810 Age-related osteoporosis without current pathological fracture: Secondary | ICD-10-CM

## 2021-10-28 DIAGNOSIS — E039 Hypothyroidism, unspecified: Secondary | ICD-10-CM

## 2021-10-28 DIAGNOSIS — I10 Essential (primary) hypertension: Secondary | ICD-10-CM

## 2021-10-28 DIAGNOSIS — E7800 Pure hypercholesterolemia, unspecified: Secondary | ICD-10-CM | POA: Diagnosis not present

## 2021-10-28 MED ORDER — SHINGRIX 50 MCG/0.5ML IM SUSR
0.5000 mL | Freq: Once | INTRAMUSCULAR | 1 refills | Status: AC
Start: 2021-10-28 — End: 2021-10-28

## 2021-10-29 LAB — COMPLETE METABOLIC PANEL WITH GFR
AG Ratio: 1.7 (calc) (ref 1.0–2.5)
ALT: 17 U/L (ref 6–29)
AST: 21 U/L (ref 10–35)
Albumin: 4.3 g/dL (ref 3.6–5.1)
Alkaline phosphatase (APISO): 60 U/L (ref 37–153)
BUN: 14 mg/dL (ref 7–25)
CO2: 27 mmol/L (ref 20–32)
Calcium: 9.7 mg/dL (ref 8.6–10.4)
Chloride: 106 mmol/L (ref 98–110)
Creat: 0.82 mg/dL (ref 0.60–0.95)
Globulin: 2.5 g/dL (calc) (ref 1.9–3.7)
Glucose, Bld: 90 mg/dL (ref 65–99)
Potassium: 4.6 mmol/L (ref 3.5–5.3)
Sodium: 142 mmol/L (ref 135–146)
Total Bilirubin: 0.5 mg/dL (ref 0.2–1.2)
Total Protein: 6.8 g/dL (ref 6.1–8.1)
eGFR: 71 mL/min/{1.73_m2} (ref >=60)

## 2021-10-29 LAB — LIPID PANEL
Cholesterol: 141 mg/dL (ref <200)
HDL: 67 mg/dL (ref >=50)
LDL Cholesterol (Calc): 53 mg/dL (calc)
Non-HDL Cholesterol (Calc): 74 mg/dL (calc) (ref <130)
Total CHOL/HDL Ratio: 2.1 (calc) (ref <5.0)
Triglycerides: 124 mg/dL (ref <150)

## 2021-10-29 LAB — PTH, INTACT AND CALCIUM
Calcium: 9.7 mg/dL (ref 8.6–10.4)
PTH: 106 pg/mL — ABNORMAL HIGH (ref 16–77)

## 2021-10-29 LAB — TSH: TSH: 2.78 mIU/L (ref 0.40–4.50)

## 2021-11-15 ENCOUNTER — Other Ambulatory Visit: Payer: Self-pay

## 2021-11-15 MED ORDER — LEVOTHYROXINE SODIUM 25 MCG PO TABS: ORAL_TABLET | ORAL | 1 refills | Status: DC

## 2021-11-20 ENCOUNTER — Encounter: Payer: Self-pay | Admitting: General Practice

## 2022-01-09 ENCOUNTER — Other Ambulatory Visit: Payer: Self-pay | Admitting: Medical-Surgical

## 2022-02-21 ENCOUNTER — Other Ambulatory Visit: Payer: Self-pay | Admitting: Medical-Surgical

## 2022-03-04 ENCOUNTER — Telehealth: Payer: Self-pay | Admitting: General Practice

## 2022-03-04 NOTE — Telephone Encounter (Signed)
Left message for patient to call and schedule annual/medicare wellness visit.

## 2022-04-14 ENCOUNTER — Telehealth: Payer: Self-pay

## 2022-04-14 NOTE — Progress Notes (Signed)
  Care Coordination Note  04/14/2022 Name: LAURA-LEE VILLEGAS MRN: [id number] DOB: [birth year]  Avanell Shackleton Wieser is a 82 y.o. year old female who is a primary care patient of Samuel Bouche, NP and is actively engaged with the care management team. I reached out to [place] by phone today to assist with re-scheduling a follow up visit with the Pharmacist  Follow up plan: Patient  has Moved and is no longer at [facility], [place], [place] [address] Direct Dial: [phone] [email or web address]

## 2022-04-14 NOTE — Progress Notes (Signed)
  Care Coordination Note  04/14/2022 Name: [name] MRN: [id number] DOB: [birth year]  Jennifer Ray is a 82 y.o. year old female who is a primary care patient of Samuel Bouche, NP and is actively engaged with the care management team. I reached out to [place] by phone today to assist with re-scheduling a follow up visit with the Pharmacist  Follow up plan: Unsuccessful telephone outreach attempt made. A HIPAA compliant phone message was left for the patient providing contact information and requesting a return call.  The care management team will reach out to the patient again over the next 7 days.  If patient returns call to provider office, please advise to call [facility]  at [place], [place], [place] [address] Direct Dial: [phone] [name]

## 2022-04-15 ENCOUNTER — Telehealth: Payer: Medicare Other

## 2022-04-30 ENCOUNTER — Ambulatory Visit: Payer: Medicare Other | Admitting: Medical-Surgical

## 2022-05-12 ENCOUNTER — Other Ambulatory Visit: Payer: Self-pay | Admitting: Medical-Surgical

## 2022-05-23 ENCOUNTER — Other Ambulatory Visit: Payer: Self-pay | Admitting: Medical-Surgical

## 2022-06-07 ENCOUNTER — Other Ambulatory Visit: Payer: Self-pay | Admitting: Medical-Surgical

## 2022-07-15 IMAGING — MG DIGITAL SCREENING BILAT W/ TOMO W/ CAD
8 of 14 series · 8 of 40 positions shown · non-contrast
Comparison: Previous exam(s).

CLINICAL DATA: Screening.

EXAM:
DIGITAL SCREENING BILATERAL MAMMOGRAM WITH TOMO AND CAD

[L CC synth-2D]
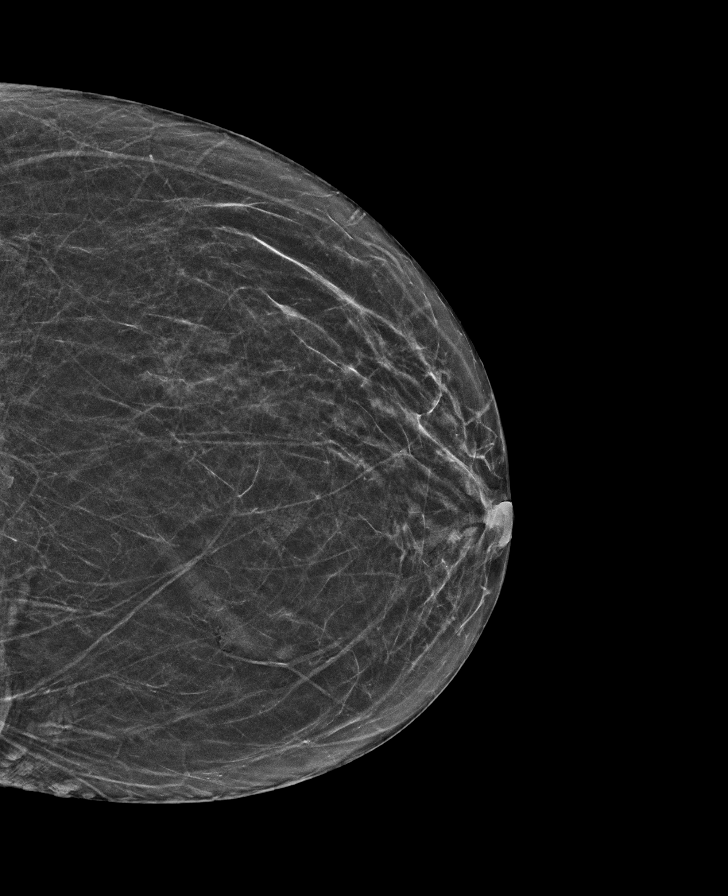

[L MLO synth-2D (1 of 2)]
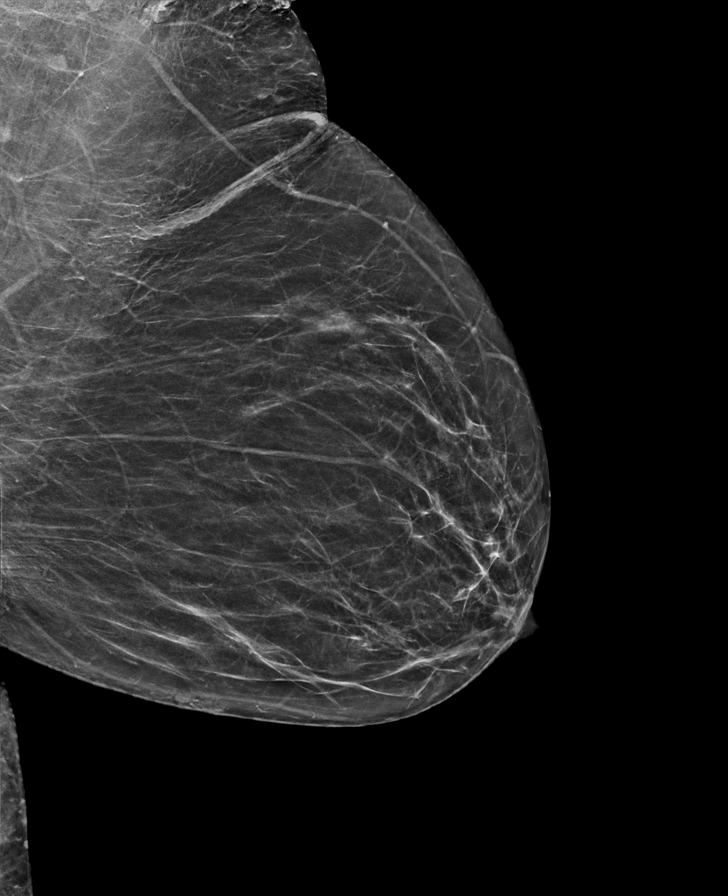

[L MLO synth-2D (2 of 2)]
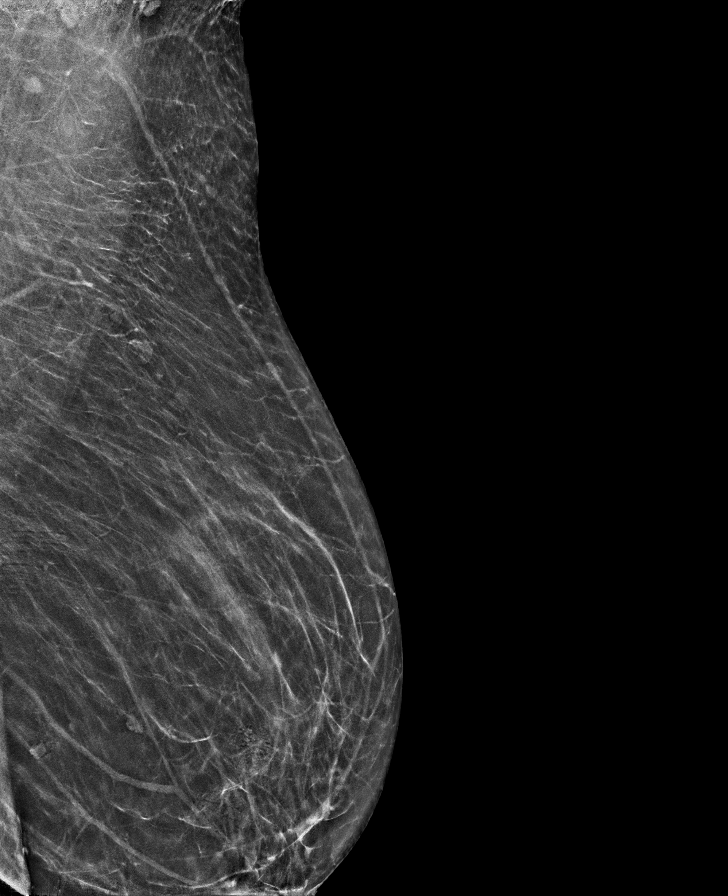

[L XCCM synth-2D]
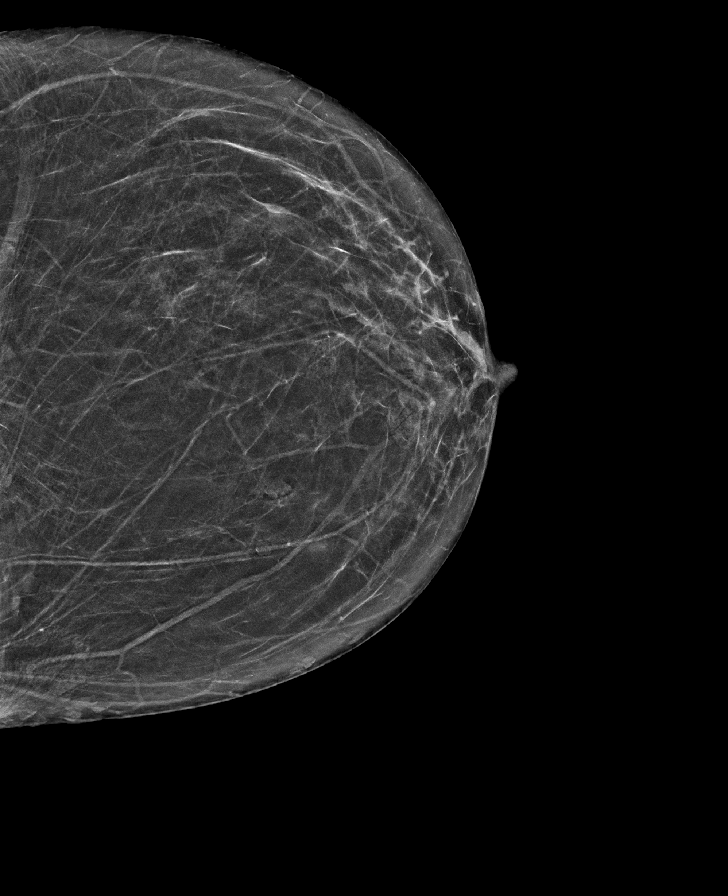

[R CC synth-2D]
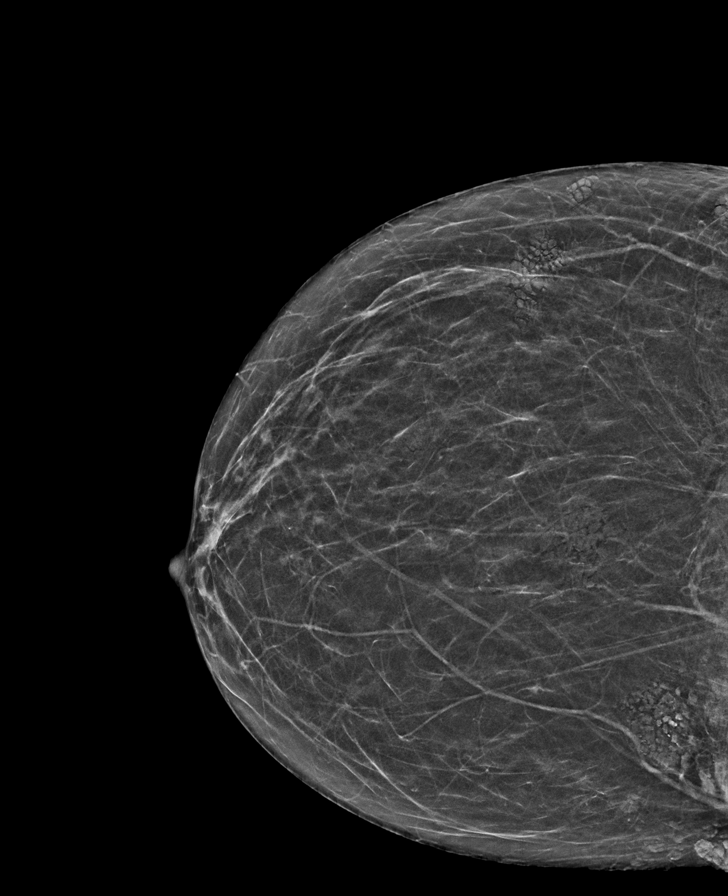

[R MLO synth-2D (1 of 2)]
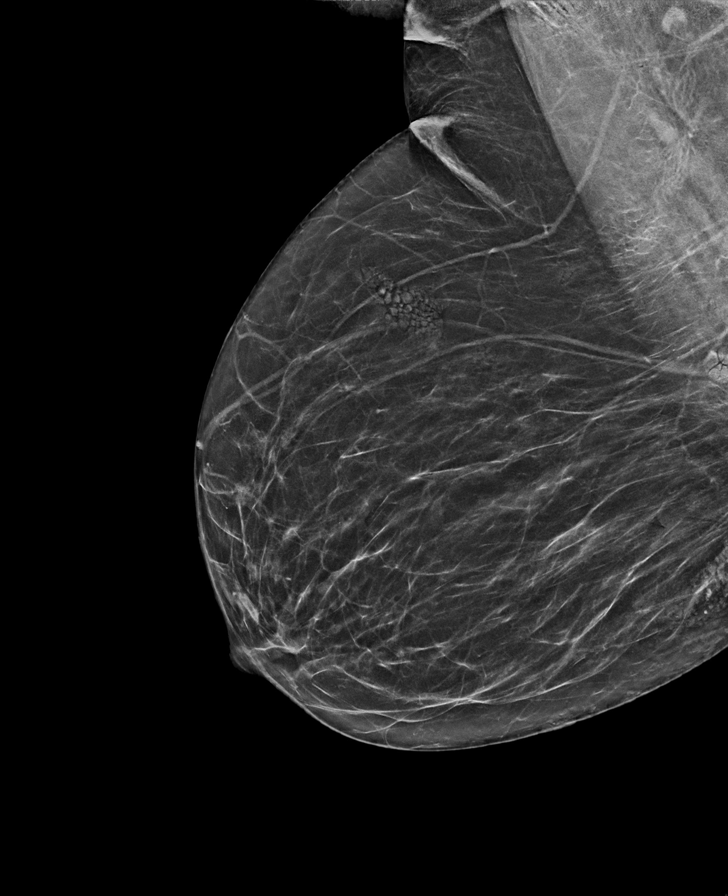

[R MLO synth-2D (2 of 2)]
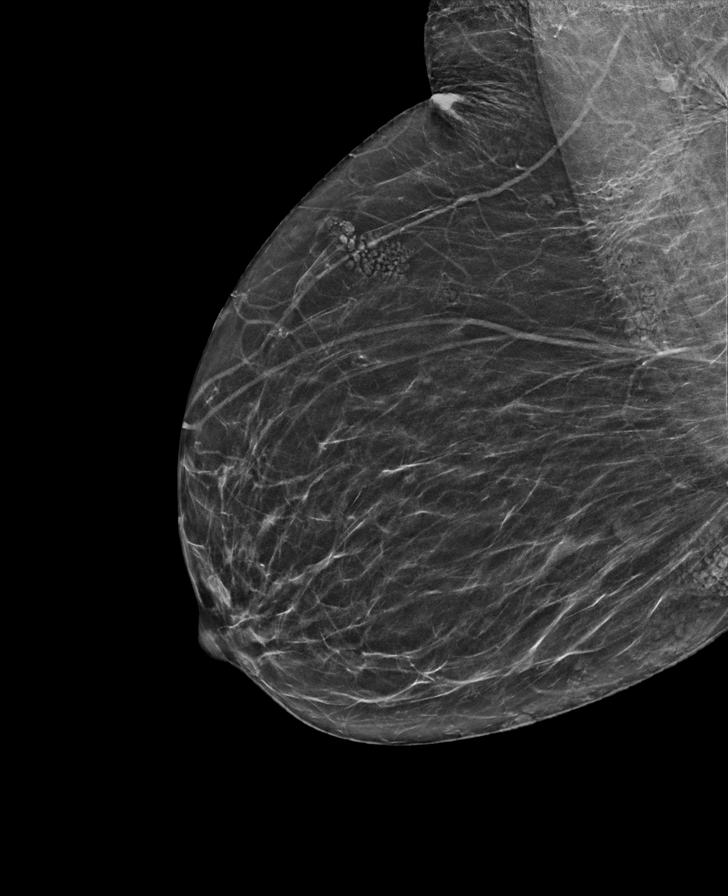

[L MLO tomo · tomo slice 29/58.0]
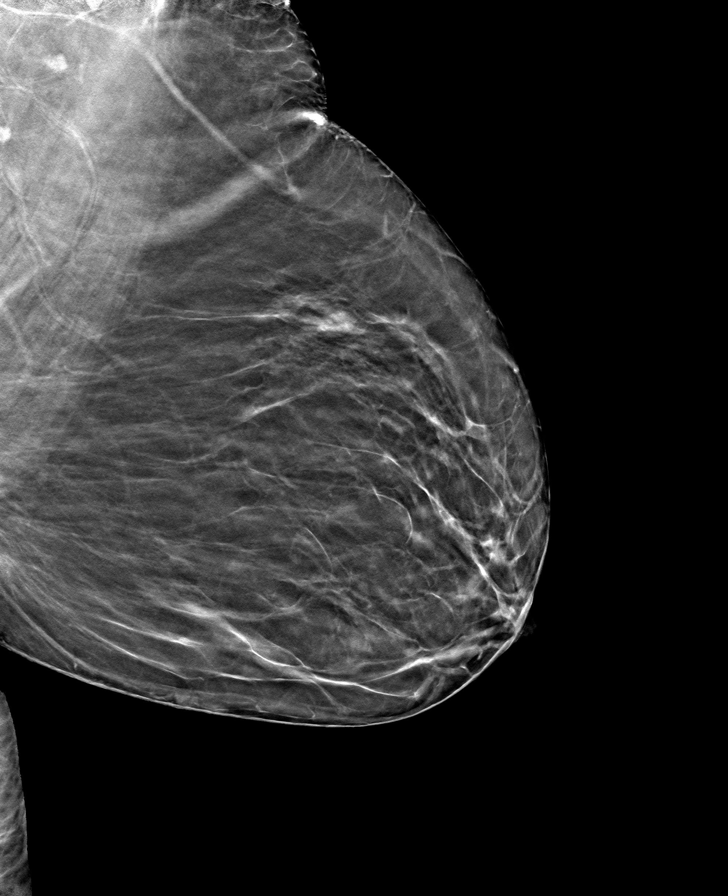

[8 of 40 positions shown; findings below may reference images not displayed]

ACR Breast Density Category b: There are scattered areas of
fibroglandular density.
FINDINGS: There are no findings suspicious for malignancy. Images were
processed with CAD.
IMPRESSION: No mammographic evidence of malignancy. A result letter of this
screening mammogram will be mailed directly to the patient.

RECOMMENDATION:
Screening mammogram in one year. (Code:CN-U-775)

BI-RADS CATEGORY  1: Negative.
